# Patient Record
Sex: Male | Born: 1985 | Race: Black or African American | Hispanic: No | Marital: Single | Smoking: Former smoker
Health system: Southern US, Community
[De-identification: ages and names within clinical notes are randomized; demographics above are authoritative.]

## PROBLEM LIST (undated history)

## (undated) DIAGNOSIS — F419 Anxiety disorder, unspecified: Secondary | ICD-10-CM

## (undated) DIAGNOSIS — F17200 Nicotine dependence, unspecified, uncomplicated: Secondary | ICD-10-CM

## (undated) DIAGNOSIS — F329 Major depressive disorder, single episode, unspecified: Secondary | ICD-10-CM

## (undated) DIAGNOSIS — K029 Dental caries, unspecified: Secondary | ICD-10-CM

## (undated) DIAGNOSIS — F32A Depression, unspecified: Secondary | ICD-10-CM

## (undated) DIAGNOSIS — W3400XA Accidental discharge from unspecified firearms or gun, initial encounter: Secondary | ICD-10-CM

## (undated) DIAGNOSIS — R519 Headache, unspecified: Secondary | ICD-10-CM

## (undated) DIAGNOSIS — F319 Bipolar disorder, unspecified: Secondary | ICD-10-CM

## (undated) DIAGNOSIS — F209 Schizophrenia, unspecified: Secondary | ICD-10-CM

## (undated) DIAGNOSIS — R51 Headache: Secondary | ICD-10-CM

## (undated) HISTORY — PX: MULTIPLE TOOTH EXTRACTIONS: SHX2053

---

## 2016-08-23 ENCOUNTER — Inpatient Hospital Stay (HOSPITAL_COMMUNITY)
Admission: EM | Admit: 2016-08-23 | Discharge: 2016-08-29 | DRG: 981 | Disposition: A | Discharge status: Home Health | Payer: Medicaid Other | Attending: General Surgery | Admitting: General Surgery

## 2016-08-23 DIAGNOSIS — W3400XA Accidental discharge from unspecified firearms or gun, initial encounter: Secondary | ICD-10-CM

## 2016-08-23 DIAGNOSIS — R402253 Coma scale, best verbal response, oriented, at hospital admission: Secondary | ICD-10-CM | POA: Diagnosis present

## 2016-08-23 DIAGNOSIS — R402143 Coma scale, eyes open, spontaneous, at hospital admission: Secondary | ICD-10-CM | POA: Diagnosis present

## 2016-08-23 DIAGNOSIS — T148XXA Other injury of unspecified body region, initial encounter: Secondary | ICD-10-CM

## 2016-08-23 DIAGNOSIS — S3210XB Unspecified fracture of sacrum, initial encounter for open fracture: Secondary | ICD-10-CM | POA: Diagnosis present

## 2016-08-23 DIAGNOSIS — F1290 Cannabis use, unspecified, uncomplicated: Secondary | ICD-10-CM | POA: Diagnosis present

## 2016-08-23 DIAGNOSIS — R402363 Coma scale, best motor response, obeys commands, at hospital admission: Secondary | ICD-10-CM | POA: Diagnosis present

## 2016-08-23 DIAGNOSIS — F17210 Nicotine dependence, cigarettes, uncomplicated: Secondary | ICD-10-CM | POA: Diagnosis present

## 2016-08-23 DIAGNOSIS — Z87820 Personal history of traumatic brain injury: Secondary | ICD-10-CM

## 2016-08-23 DIAGNOSIS — S3662XA Contusion of rectum, initial encounter: Principal | ICD-10-CM | POA: Diagnosis present

## 2016-08-23 HISTORY — DX: Nicotine dependence, unspecified, uncomplicated: F17.200

## 2016-08-23 MED ORDER — FENTANYL CITRATE (PF) 100 MCG/2ML IJ SOLN
50.0000 ug | Freq: Once | INTRAMUSCULAR | Status: AC
  Administered 2016-08-24: 50 ug via INTRAVENOUS
  Filled 2016-08-23: qty 2

## 2016-08-23 MED ORDER — FENTANYL CITRATE (PF) 100 MCG/2ML IJ SOLN
INTRAMUSCULAR | Status: AC
  Administered 2016-08-24: 50 ug via INTRAVENOUS
  Filled 2016-08-23: qty 2

## 2016-08-23 MED ORDER — IOPAMIDOL (ISOVUE-300) INJECTION 61%
INTRAVENOUS | Status: AC
  Administered 2016-08-24: 100 mL
  Filled 2016-08-23: qty 100

## 2016-08-24 ENCOUNTER — Inpatient Hospital Stay (HOSPITAL_COMMUNITY): Payer: Medicaid Other

## 2016-08-24 ENCOUNTER — Emergency Department (HOSPITAL_COMMUNITY): Payer: Medicaid Other

## 2016-08-24 ENCOUNTER — Emergency Department (HOSPITAL_COMMUNITY): Payer: Medicaid Other | Admitting: Certified Registered"

## 2016-08-24 ENCOUNTER — Encounter (HOSPITAL_COMMUNITY): Admission: EM | Disposition: A | Discharge status: Home Health | Payer: Self-pay | Source: Home / Self Care

## 2016-08-24 ENCOUNTER — Encounter (HOSPITAL_COMMUNITY): Payer: Self-pay | Admitting: Emergency Medicine

## 2016-08-24 ENCOUNTER — Other Ambulatory Visit (HOSPITAL_COMMUNITY): Payer: Self-pay | Admitting: Radiology

## 2016-08-24 DIAGNOSIS — Z87820 Personal history of traumatic brain injury: Secondary | ICD-10-CM | POA: Diagnosis not present

## 2016-08-24 DIAGNOSIS — R402253 Coma scale, best verbal response, oriented, at hospital admission: Secondary | ICD-10-CM | POA: Diagnosis not present

## 2016-08-24 DIAGNOSIS — S31819A Unspecified open wound of right buttock, initial encounter: Secondary | ICD-10-CM | POA: Diagnosis present

## 2016-08-24 DIAGNOSIS — W3400XA Accidental discharge from unspecified firearms or gun, initial encounter: Secondary | ICD-10-CM | POA: Diagnosis not present

## 2016-08-24 DIAGNOSIS — F1290 Cannabis use, unspecified, uncomplicated: Secondary | ICD-10-CM | POA: Diagnosis not present

## 2016-08-24 DIAGNOSIS — S3210XB Unspecified fracture of sacrum, initial encounter for open fracture: Secondary | ICD-10-CM | POA: Diagnosis not present

## 2016-08-24 DIAGNOSIS — F17210 Nicotine dependence, cigarettes, uncomplicated: Secondary | ICD-10-CM | POA: Diagnosis not present

## 2016-08-24 DIAGNOSIS — S3662XA Contusion of rectum, initial encounter: Secondary | ICD-10-CM | POA: Diagnosis not present

## 2016-08-24 DIAGNOSIS — R402363 Coma scale, best motor response, obeys commands, at hospital admission: Secondary | ICD-10-CM | POA: Diagnosis not present

## 2016-08-24 DIAGNOSIS — R402143 Coma scale, eyes open, spontaneous, at hospital admission: Secondary | ICD-10-CM | POA: Diagnosis not present

## 2016-08-24 HISTORY — PX: COLOSTOMY: SHX63

## 2016-08-24 HISTORY — PX: LAPAROTOMY: SHX154

## 2016-08-24 LAB — PROTIME-INR
INR: 1.05
Prothrombin Time: 13.7 seconds (ref 11.4–15.2)

## 2016-08-24 LAB — URINALYSIS, ROUTINE W REFLEX MICROSCOPIC
BILIRUBIN URINE: NEGATIVE
Glucose, UA: NEGATIVE mg/dL
HGB URINE DIPSTICK: NEGATIVE
Ketones, ur: NEGATIVE mg/dL
Leukocytes, UA: NEGATIVE
NITRITE: NEGATIVE
PROTEIN: NEGATIVE mg/dL
pH: 5 (ref 5.0–8.0)

## 2016-08-24 LAB — PREPARE FRESH FROZEN PLASMA
UNIT DIVISION: 0
UNIT DIVISION: 0

## 2016-08-24 LAB — ETHANOL: Alcohol, Ethyl (B): <5 mg/dL (ref <5)

## 2016-08-24 LAB — COMPREHENSIVE METABOLIC PANEL
ALT: 22 U/L (ref 17–63)
ANION GAP: 10 (ref 5–15)
AST: 31 U/L (ref 15–41)
Albumin: 3.9 g/dL (ref 3.5–5.0)
Alkaline Phosphatase: 65 U/L (ref 38–126)
BUN: 9 mg/dL (ref 6–20)
CO2: 22 mmol/L (ref 22–32)
Calcium: 8.9 mg/dL (ref 8.9–10.3)
Chloride: 104 mmol/L (ref 101–111)
Creatinine, Ser: 1.24 mg/dL (ref 0.61–1.24)
Glucose, Bld: 133 mg/dL — ABNORMAL HIGH (ref 65–99)
POTASSIUM: 2.7 mmol/L — AB (ref 3.5–5.1)
Sodium: 136 mmol/L (ref 135–145)
TOTAL PROTEIN: 6.4 g/dL — AB (ref 6.5–8.1)
Total Bilirubin: 0.8 mg/dL (ref 0.3–1.2)

## 2016-08-24 LAB — CBC
HEMATOCRIT: 37.8 % — AB (ref 39.0–52.0)
HEMATOCRIT: 40.2 % (ref 39.0–52.0)
HEMOGLOBIN: 12.2 g/dL — AB (ref 13.0–17.0)
HEMOGLOBIN: 13.3 g/dL (ref 13.0–17.0)
MCH: 28.1 pg (ref 26.0–34.0)
MCH: 28.7 pg (ref 26.0–34.0)
MCHC: 32.3 g/dL (ref 30.0–36.0)
MCHC: 33.1 g/dL (ref 30.0–36.0)
MCV: 86.8 fL (ref 78.0–100.0)
MCV: 87.1 fL (ref 78.0–100.0)
Platelets: 266 10*3/uL (ref 150–400)
Platelets: 272 10*3/uL (ref 150–400)
RBC: 4.34 MIL/uL (ref 4.22–5.81)
RBC: 4.63 MIL/uL (ref 4.22–5.81)
RDW: 12.6 % (ref 11.5–15.5)
RDW: 12.6 % (ref 11.5–15.5)
WBC: 10.4 10*3/uL (ref 4.0–10.5)
WBC: 17.3 10*3/uL — AB (ref 4.0–10.5)

## 2016-08-24 LAB — CREATININE, SERUM: Creatinine, Ser: 1.2 mg/dL (ref 0.61–1.24)

## 2016-08-24 LAB — I-STAT CHEM 8, ED
BUN: 9 mg/dL (ref 6–20)
CALCIUM ION: 1.1 mmol/L — AB (ref 1.15–1.40)
CREATININE: 1.1 mg/dL (ref 0.61–1.24)
Chloride: 103 mmol/L (ref 101–111)
Glucose, Bld: 133 mg/dL — ABNORMAL HIGH (ref 65–99)
HCT: 41 % (ref 39.0–52.0)
Hemoglobin: 13.9 g/dL (ref 13.0–17.0)
Potassium: 3 mmol/L — ABNORMAL LOW (ref 3.5–5.1)
Sodium: 140 mmol/L (ref 135–145)
TCO2: 25 mmol/L (ref 0–100)

## 2016-08-24 LAB — ABO/RH: ABO/RH(D): B POS

## 2016-08-24 LAB — BPAM FFP
BLOOD PRODUCT EXPIRATION DATE: 201808092359
Blood Product Expiration Date: 201808092359
ISSUE DATE / TIME: 201808062338
ISSUE DATE / TIME: 201808062338
UNIT TYPE AND RH: 6200
UNIT TYPE AND RH: 6200

## 2016-08-24 LAB — BLOOD PRODUCT ORDER (VERBAL) VERIFICATION

## 2016-08-24 LAB — I-STAT CG4 LACTIC ACID, ED: LACTIC ACID, VENOUS: 4.29 mmol/L — AB (ref 0.5–1.9)

## 2016-08-24 LAB — CDS SEROLOGY

## 2016-08-24 LAB — MRSA PCR SCREENING: MRSA BY PCR: NEGATIVE

## 2016-08-24 SURGERY — LAPAROTOMY, EXPLORATORY
Anesthesia: General | Site: Abdomen

## 2016-08-24 MED ORDER — DIPHENHYDRAMINE HCL 50 MG/ML IJ SOLN: 25.0000 mg | Freq: Four times a day (QID) | INTRAMUSCULAR | Status: DC | PRN

## 2016-08-24 MED ORDER — DEXAMETHASONE SODIUM PHOSPHATE 10 MG/ML IJ SOLN
INTRAMUSCULAR | Status: DC | PRN
  Administered 2016-08-24: 10 mg via INTRAVENOUS

## 2016-08-24 MED ORDER — 0.9 % SODIUM CHLORIDE (POUR BTL) OPTIME
TOPICAL | Status: DC | PRN
  Administered 2016-08-24: 3000 mL

## 2016-08-24 MED ORDER — DIPHENHYDRAMINE HCL 25 MG PO CAPS: 25.0000 mg | ORAL_CAPSULE | Freq: Four times a day (QID) | ORAL | Status: DC | PRN

## 2016-08-24 MED ORDER — SUGAMMADEX SODIUM 200 MG/2ML IV SOLN
INTRAVENOUS | Status: DC | PRN
  Administered 2016-08-24: 200 mg via INTRAVENOUS

## 2016-08-24 MED ORDER — SUFENTANIL CITRATE 50 MCG/ML IV SOLN
INTRAVENOUS | Status: DC | PRN
  Administered 2016-08-24: 10 ug via INTRAVENOUS
  Administered 2016-08-24: 15 ug via INTRAVENOUS
  Administered 2016-08-24: 10 ug via INTRAVENOUS
  Administered 2016-08-24: 15 ug via INTRAVENOUS

## 2016-08-24 MED ORDER — SODIUM CHLORIDE 0.9% FLUSH: 9.0000 mL | INTRAVENOUS | Status: DC | PRN

## 2016-08-24 MED ORDER — FENTANYL CITRATE (PF) 100 MCG/2ML IJ SOLN
INTRAMUSCULAR | Status: AC
  Filled 2016-08-24: qty 2

## 2016-08-24 MED ORDER — ACETAMINOPHEN 500 MG PO TABS
1000.0000 mg | ORAL_TABLET | Freq: Three times a day (TID) | ORAL | Status: DC
  Administered 2016-08-24 – 2016-08-29 (×15): 1000 mg via ORAL
  Filled 2016-08-24 (×14): qty 2

## 2016-08-24 MED ORDER — GLYCOPYRROLATE 0.2 MG/ML IJ SOLN
INTRAMUSCULAR | Status: DC | PRN
  Administered 2016-08-24: 0.2 mg via INTRAVENOUS

## 2016-08-24 MED ORDER — GABAPENTIN 600 MG PO TABS
600.0000 mg | ORAL_TABLET | Freq: Three times a day (TID) | ORAL | Status: DC
  Administered 2016-08-24 – 2016-08-27 (×11): 600 mg via ORAL
  Filled 2016-08-24 (×13): qty 1

## 2016-08-24 MED ORDER — SODIUM CHLORIDE 0.9 % IV SOLN: INTRAVENOUS | Status: DC | PRN

## 2016-08-24 MED ORDER — HYDROMORPHONE HCL 1 MG/ML IJ SOLN
INTRAMUSCULAR | Status: AC
  Filled 2016-08-24: qty 1

## 2016-08-24 MED ORDER — ONDANSETRON HCL 4 MG/2ML IJ SOLN: 4.0000 mg | Freq: Four times a day (QID) | INTRAMUSCULAR | Status: DC | PRN

## 2016-08-24 MED ORDER — ROCURONIUM BROMIDE 100 MG/10ML IV SOLN
INTRAVENOUS | Status: DC | PRN
  Administered 2016-08-24: 50 mg via INTRAVENOUS

## 2016-08-24 MED ORDER — HYDROMORPHONE HCL 1 MG/ML IJ SOLN
0.2500 mg | INTRAMUSCULAR | Status: DC | PRN
  Administered 2016-08-24 (×2): 0.5 mg via INTRAVENOUS

## 2016-08-24 MED ORDER — SODIUM CHLORIDE 0.9 % IJ SOLN
INTRAMUSCULAR | Status: AC
  Filled 2016-08-24: qty 10

## 2016-08-24 MED ORDER — PROPOFOL 10 MG/ML IV BOLUS
INTRAVENOUS | Status: DC | PRN
  Administered 2016-08-24: 50 mg via INTRAVENOUS
  Administered 2016-08-24: 100 mg via INTRAVENOUS
  Administered 2016-08-24: 150 mg via INTRAVENOUS

## 2016-08-24 MED ORDER — LIDOCAINE 2% (20 MG/ML) 5 ML SYRINGE
INTRAMUSCULAR | Status: AC
  Filled 2016-08-24: qty 5

## 2016-08-24 MED ORDER — PROPOFOL 10 MG/ML IV BOLUS
INTRAVENOUS | Status: AC
  Filled 2016-08-24: qty 20

## 2016-08-24 MED ORDER — LACTATED RINGERS IV SOLN
INTRAVENOUS | Status: DC | PRN
  Administered 2016-08-24: 01:00:00 via INTRAVENOUS

## 2016-08-24 MED ORDER — SUCCINYLCHOLINE CHLORIDE 200 MG/10ML IV SOSY
PREFILLED_SYRINGE | INTRAVENOUS | Status: AC
  Filled 2016-08-24: qty 10

## 2016-08-24 MED ORDER — SUCCINYLCHOLINE CHLORIDE 20 MG/ML IJ SOLN
INTRAMUSCULAR | Status: DC | PRN
Start: 2016-08-24 — End: 2016-08-24
  Administered 2016-08-24: 140 mg via INTRAVENOUS

## 2016-08-24 MED ORDER — DIPHENHYDRAMINE HCL 12.5 MG/5ML PO ELIX: 12.5000 mg | ORAL_SOLUTION | Freq: Four times a day (QID) | ORAL | Status: DC | PRN

## 2016-08-24 MED ORDER — ONDANSETRON HCL 4 MG/2ML IJ SOLN: 4.0000 mg | Freq: Once | INTRAMUSCULAR | Status: DC | PRN

## 2016-08-24 MED ORDER — ENOXAPARIN SODIUM 40 MG/0.4ML ~~LOC~~ SOLN
40.0000 mg | SUBCUTANEOUS | Status: DC
  Administered 2016-08-26 – 2016-08-28 (×3): 40 mg via SUBCUTANEOUS
  Filled 2016-08-24 (×4): qty 0.4

## 2016-08-24 MED ORDER — MIDAZOLAM HCL 5 MG/5ML IJ SOLN
INTRAMUSCULAR | Status: DC | PRN
  Administered 2016-08-24: 2 mg via INTRAVENOUS

## 2016-08-24 MED ORDER — SUFENTANIL CITRATE 50 MCG/ML IV SOLN
INTRAVENOUS | Status: AC
  Filled 2016-08-24: qty 1

## 2016-08-24 MED ORDER — HYDRALAZINE HCL 20 MG/ML IJ SOLN: 10.0000 mg | INTRAMUSCULAR | Status: DC | PRN

## 2016-08-24 MED ORDER — HYDROMORPHONE 1 MG/ML IV SOLN
INTRAVENOUS | Status: DC
Start: 2016-08-24 — End: 2016-08-25
  Administered 2016-08-24: 0.6 mg via INTRAVENOUS
  Administered 2016-08-24: 10:00:00 via INTRAVENOUS
  Administered 2016-08-24 – 2016-08-25 (×2): 0.9 mg via INTRAVENOUS
  Filled 2016-08-24: qty 25

## 2016-08-24 MED ORDER — ONDANSETRON 4 MG PO TBDP
4.0000 mg | ORAL_TABLET | Freq: Four times a day (QID) | ORAL | Status: DC | PRN
  Administered 2016-08-25: 4 mg via ORAL
  Filled 2016-08-24: qty 1

## 2016-08-24 MED ORDER — PIPERACILLIN-TAZOBACTAM 3.375 G IVPB
3.3750 g | Freq: Three times a day (TID) | INTRAVENOUS | Status: DC
  Administered 2016-08-24 – 2016-08-29 (×15): 3.375 g via INTRAVENOUS
  Filled 2016-08-24 (×18): qty 50

## 2016-08-24 MED ORDER — KCL IN DEXTROSE-NACL 20-5-0.45 MEQ/L-%-% IV SOLN
INTRAVENOUS | Status: DC
  Administered 2016-08-24 – 2016-08-27 (×6): via INTRAVENOUS
  Filled 2016-08-24 (×8): qty 1000

## 2016-08-24 MED ORDER — TRAMADOL HCL 50 MG PO TABS: 50.0000 mg | ORAL_TABLET | Freq: Four times a day (QID) | ORAL | Status: DC | PRN

## 2016-08-24 MED ORDER — ONDANSETRON HCL 4 MG/2ML IJ SOLN
INTRAMUSCULAR | Status: DC | PRN
  Administered 2016-08-24: 4 mg via INTRAVENOUS

## 2016-08-24 MED ORDER — FENTANYL CITRATE (PF) 100 MCG/2ML IJ SOLN
INTRAMUSCULAR | Status: AC | PRN
  Administered 2016-08-23 – 2016-08-24 (×2): 50 ug via INTRAVENOUS

## 2016-08-24 MED ORDER — SODIUM CHLORIDE 0.9 % IV SOLN
INTRAVENOUS | Status: DC | PRN
  Administered 2016-08-23: 1000 mL via INTRAVENOUS

## 2016-08-24 MED ORDER — LIDOCAINE HCL (CARDIAC) 20 MG/ML IV SOLN
INTRAVENOUS | Status: DC | PRN
  Administered 2016-08-24: 100 mg via INTRATRACHEAL

## 2016-08-24 MED ORDER — DEXAMETHASONE SODIUM PHOSPHATE 10 MG/ML IJ SOLN
INTRAMUSCULAR | Status: AC
  Filled 2016-08-24: qty 1

## 2016-08-24 MED ORDER — DIPHENHYDRAMINE HCL 50 MG/ML IJ SOLN: 12.5000 mg | Freq: Four times a day (QID) | INTRAMUSCULAR | Status: DC | PRN

## 2016-08-24 MED ORDER — OXYCODONE HCL 5 MG PO TABS
5.0000 mg | ORAL_TABLET | ORAL | Status: DC | PRN
  Administered 2016-08-24 – 2016-08-26 (×5): 10 mg via ORAL
  Administered 2016-08-27: 5 mg via ORAL
  Administered 2016-08-27 – 2016-08-29 (×9): 10 mg via ORAL
  Filled 2016-08-24 (×9): qty 2
  Filled 2016-08-24: qty 1
  Filled 2016-08-24 (×5): qty 2

## 2016-08-24 MED ORDER — MEPERIDINE HCL 25 MG/ML IJ SOLN: 6.2500 mg | INTRAMUSCULAR | Status: DC | PRN

## 2016-08-24 MED ORDER — SUGAMMADEX SODIUM 200 MG/2ML IV SOLN
INTRAVENOUS | Status: AC
  Filled 2016-08-24: qty 2

## 2016-08-24 MED ORDER — ONDANSETRON HCL 4 MG/2ML IJ SOLN
INTRAMUSCULAR | Status: AC
  Filled 2016-08-24: qty 2

## 2016-08-24 MED ORDER — DEXTROSE 5 % IV SOLN
2.0000 g | Freq: Once | INTRAVENOUS | Status: AC
  Administered 2016-08-24: 2 g via INTRAVENOUS
  Filled 2016-08-24: qty 2

## 2016-08-24 MED ORDER — SODIUM CHLORIDE 0.9 % IV SOLN
INTRAVENOUS | Status: DC | PRN
  Administered 2016-08-24: 01:00:00 via INTRAVENOUS

## 2016-08-24 MED ORDER — METHOCARBAMOL 1000 MG/10ML IJ SOLN
1000.0000 mg | Freq: Three times a day (TID) | INTRAVENOUS | Status: DC | PRN
  Administered 2016-08-24: 1000 mg via INTRAVENOUS
  Filled 2016-08-24: qty 10

## 2016-08-24 MED ORDER — MIDAZOLAM HCL 2 MG/2ML IJ SOLN
INTRAMUSCULAR | Status: AC
  Filled 2016-08-24: qty 2

## 2016-08-24 MED ORDER — NALOXONE HCL 0.4 MG/ML IJ SOLN: 0.4000 mg | INTRAMUSCULAR | Status: DC | PRN

## 2016-08-24 MED ORDER — ROCURONIUM BROMIDE 10 MG/ML (PF) SYRINGE
PREFILLED_SYRINGE | INTRAVENOUS | Status: AC
  Filled 2016-08-24: qty 5

## 2016-08-24 MED ORDER — PANTOPRAZOLE SODIUM 40 MG IV SOLR
40.0000 mg | Freq: Every day | INTRAVENOUS | Status: DC
  Administered 2016-08-24 – 2016-08-27 (×4): 40 mg via INTRAVENOUS
  Filled 2016-08-24 (×4): qty 40

## 2016-08-24 MED ORDER — HYDROMORPHONE HCL 1 MG/ML IJ SOLN
1.0000 mg | INTRAMUSCULAR | Status: DC | PRN
  Administered 2016-08-24: 1 mg via INTRAVENOUS
  Filled 2016-08-24: qty 1

## 2016-08-24 SURGICAL SUPPLY — 36 items
BLADE CLIPPER SURG (BLADE) ×3 IMPLANT
CHLORAPREP W/TINT 26ML (MISCELLANEOUS) ×3 IMPLANT
COVER SURGICAL LIGHT HANDLE (MISCELLANEOUS) ×3 IMPLANT
DRAPE LAPAROSCOPIC ABDOMINAL (DRAPES) ×3 IMPLANT
DRAPE WARM FLUID 44X44 (DRAPE) ×3 IMPLANT
DRSG OPSITE POSTOP 4X10 (GAUZE/BANDAGES/DRESSINGS) ×3 IMPLANT
ELECT BLADE 6.5 EXT (BLADE) ×3 IMPLANT
ELECT CAUTERY BLADE 6.4 (BLADE) ×3 IMPLANT
ELECT REM PT RETURN 9FT ADLT (ELECTROSURGICAL) ×3
ELECTRODE REM PT RTRN 9FT ADLT (ELECTROSURGICAL) ×1 IMPLANT
GLOVE BIO SURGEON STRL SZ8 (GLOVE) ×3 IMPLANT
GLOVE BIOGEL PI IND STRL 8 (GLOVE) ×1 IMPLANT
GLOVE BIOGEL PI INDICATOR 8 (GLOVE) ×2
GOWN STRL REUS W/ TWL LRG LVL3 (GOWN DISPOSABLE) ×1 IMPLANT
GOWN STRL REUS W/ TWL XL LVL3 (GOWN DISPOSABLE) ×1 IMPLANT
GOWN STRL REUS W/TWL LRG LVL3 (GOWN DISPOSABLE) ×2
GOWN STRL REUS W/TWL XL LVL3 (GOWN DISPOSABLE) ×2
KIT BASIN OR (CUSTOM PROCEDURE TRAY) ×3 IMPLANT
KIT OSTOMY DRAINABLE 2.75 STR (WOUND CARE) ×3 IMPLANT
KIT ROOM TURNOVER OR (KITS) ×3 IMPLANT
NS IRRIG 1000ML POUR BTL (IV SOLUTION) ×12 IMPLANT
OSTOMY BRIDGE 2 1/2 (MISCELLANEOUS) ×3 IMPLANT
PACK GENERAL/GYN (CUSTOM PROCEDURE TRAY) ×3 IMPLANT
PAD ARMBOARD 7.5X6 YLW CONV (MISCELLANEOUS) ×6 IMPLANT
SPECIMEN JAR LARGE (MISCELLANEOUS) IMPLANT
SPONGE LAP 18X18 X RAY DECT (DISPOSABLE) ×6 IMPLANT
STAPLER VISISTAT 35W (STAPLE) ×3 IMPLANT
SUCTION POOLE TIP (SUCTIONS) ×3 IMPLANT
SUT PDS AB 1 TP1 96 (SUTURE) ×6 IMPLANT
SUT SILK 2 0 SH CR/8 (SUTURE) ×3 IMPLANT
SUT SILK 2 0 TIES 10X30 (SUTURE) ×3 IMPLANT
SUT SILK 3 0 SH CR/8 (SUTURE) ×3 IMPLANT
SUT SILK 3 0 TIES 10X30 (SUTURE) ×3 IMPLANT
TOWEL OR 17X26 10 PK STRL BLUE (TOWEL DISPOSABLE) ×3 IMPLANT
TRAY FOLEY W/METER SILVER 16FR (SET/KITS/TRAYS/PACK) ×3 IMPLANT
YANKAUER SUCT BULB TIP NO VENT (SUCTIONS) ×3 IMPLANT
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
## 2016-08-24 NOTE — Plan of Care (Signed)
Problem: Nutrition: Goal: Adequate nutrition will be maintained Outcome: Progressing Advanced to clears
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
## 2016-08-24 NOTE — Anesthesia Postprocedure Evaluation (Signed)
Anesthesia Post Note  Patient: Gasper SellsCorey XXXRay  Procedure(s) Performed: Procedure(s) (LRB): EXPLORATORY LAPAROTOMY (N/A) COLOSTOMY (N/A)     Patient location during evaluation: PACU Anesthesia Type: General Level of consciousness: awake and alert Pain management: pain level controlled Vital Signs Assessment: post-procedure vital signs reviewed and stable Respiratory status: spontaneous breathing, nonlabored ventilation, respiratory function stable and patient connected to nasal cannula oxygen Cardiovascular status: blood pressure returned to baseline and stable Postop Assessment: no signs of nausea or vomiting Anesthetic complications: no    Last Vitals:  Vitals:   08/24/16 0025 08/24/16 0226  BP: (!) 151/93 (!) (P) 152/86  Pulse: (!) 51 (P) 91  Resp: 19 (!) (P) 21  Temp:  (!) (P) 36.2 C    Last Pain:  Vitals:   08/23/16 2348  TempSrc:   PainSc: 10-Worst pain ever                 Vineta Carone DAVID

## 2016-08-24 NOTE — Anesthesia Procedure Notes (Signed)
Procedure Name: Intubation Date/Time: 08/24/2016 1:00 AM Performed by: Claris Che Pre-anesthesia Checklist: Patient identified, Emergency Drugs available, Suction available, Patient being monitored and Timeout performed Patient Re-evaluated:Patient Re-evaluated prior to induction Oxygen Delivery Method: Circle system utilized Preoxygenation: Pre-oxygenation with 100% oxygen Induction Type: IV induction, Rapid sequence and Cricoid Pressure applied Laryngoscope Size: Mac and 4 Grade View: Grade II Tube type: Oral Tube size: 8.0 mm Number of attempts: 1 Airway Equipment and Method: Stylet Placement Confirmation: ETT inserted through vocal cords under direct vision,  positive ETCO2 and breath sounds checked- equal and bilateral Secured at: 24 cm Tube secured with: Tape Dental Injury: Teeth and Oropharynx as per pre-operative assessment

## 2016-08-24 NOTE — ED Notes (Signed)
Patient taken to CT with RN, EMT and MD.

## 2016-08-24 NOTE — Anesthesia Preprocedure Evaluation (Signed)
Anesthesia Evaluation  Patient identified by MRN, date of birth, ID band Patient awake    Reviewed: Allergy & Precautions, NPO status , Patient's Chart, lab work & pertinent test results  Airway Mallampati: II  TM Distance: >3 FB Neck ROM: Full    Dental   Pulmonary    Pulmonary exam normal        Cardiovascular Normal cardiovascular exam     Neuro/Psych    GI/Hepatic   Endo/Other    Renal/GU      Musculoskeletal   Abdominal   Peds  Hematology   Anesthesia Other Findings   Reproductive/Obstetrics                             Anesthesia Physical Anesthesia Plan  ASA: II and emergent  Anesthesia Plan: General   Post-op Pain Management:    Induction: Intravenous, Rapid sequence and Cricoid pressure planned  PONV Risk Score and Plan: 2 and Ondansetron, Dexamethasone and Treatment may vary due to age or medical condition  Airway Management Planned: Oral ETT  Additional Equipment:   Intra-op Plan:   Post-operative Plan: Extubation in OR  Informed Consent: I have reviewed the patients History and Physical, chart, labs and discussed the procedure including the risks, benefits and alternatives for the proposed anesthesia with the patient or authorized representative who has indicated his/her understanding and acceptance.     Plan Discussed with: CRNA and Surgeon  Anesthesia Plan Comments:         Anesthesia Quick Evaluation
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
## 2016-08-24 NOTE — ED Notes (Signed)
Lactic acid results called to Dr.Delo
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
## 2016-08-24 NOTE — ED Notes (Signed)
Patient returns from CT and placed on monitor.

## 2016-08-24 NOTE — Transfer of Care (Signed)
Immediate Anesthesia Transfer of Care Note  Patient: Gasper SellsCorey XXXRay  Procedure(s) Performed: Procedure(s): EXPLORATORY LAPAROTOMY (N/A) COLOSTOMY (N/A)  Patient Location: PACU  Anesthesia Type:General  Level of Consciousness: sedated, drowsy, patient cooperative and responds to stimulation  Airway & Oxygen Therapy: Patient Spontanous Breathing and Patient connected to nasal cannula oxygen  Post-op Assessment: Report given to RN, Post -op Vital signs reviewed and stable and Patient moving all extremities X 4  Post vital signs: Reviewed and stable  Last Vitals:  Vitals:   08/24/16 0020 08/24/16 0025  BP: (!) 156/96 (!) 151/93  Pulse: (!) 51 (!) 51  Resp: (!) 21 19  Temp:      Last Pain:  Vitals:   08/23/16 2348  TempSrc:   PainSc: 10-Worst pain ever         Complications: No apparent anesthesia complications

## 2016-08-25 ENCOUNTER — Encounter (HOSPITAL_COMMUNITY): Payer: Self-pay

## 2016-08-25 LAB — TYPE AND SCREEN
ABO/RH(D): B POS
Antibody Screen: NEGATIVE
Unit division: 0
Unit division: 0

## 2016-08-25 LAB — BPAM RBC
Blood Product Expiration Date: 201808172359
Blood Product Expiration Date: 201808172359
ISSUE DATE / TIME: 201808062337
ISSUE DATE / TIME: 201808062337
Unit Type and Rh: 9500
Unit Type and Rh: 9500

## 2016-08-25 LAB — CBC
HEMATOCRIT: 33.5 % — AB (ref 39.0–52.0)
Hemoglobin: 10.8 g/dL — ABNORMAL LOW (ref 13.0–17.0)
MCH: 28.1 pg (ref 26.0–34.0)
MCHC: 32.2 g/dL (ref 30.0–36.0)
MCV: 87 fL (ref 78.0–100.0)
PLATELETS: 206 10*3/uL (ref 150–400)
RBC: 3.85 MIL/uL — ABNORMAL LOW (ref 4.22–5.81)
RDW: 12.9 % (ref 11.5–15.5)
WBC: 11.1 10*3/uL — AB (ref 4.0–10.5)

## 2016-08-25 LAB — BASIC METABOLIC PANEL
ANION GAP: 5 (ref 5–15)
BUN: 6 mg/dL (ref 6–20)
CO2: 29 mmol/L (ref 22–32)
CREATININE: 1.2 mg/dL (ref 0.61–1.24)
Calcium: 8.4 mg/dL — ABNORMAL LOW (ref 8.9–10.3)
Chloride: 102 mmol/L (ref 101–111)
Glucose, Bld: 131 mg/dL — ABNORMAL HIGH (ref 65–99)
Potassium: 4 mmol/L (ref 3.5–5.1)
SODIUM: 136 mmol/L (ref 135–145)

## 2016-08-25 MED ORDER — HYDROMORPHONE HCL 1 MG/ML IJ SOLN
1.0000 mg | INTRAMUSCULAR | Status: DC | PRN
  Administered 2016-08-25 (×3): 1 mg via INTRAVENOUS
  Filled 2016-08-25 (×3): qty 1
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
## 2016-08-26 LAB — CBC
HEMATOCRIT: 29.7 % — AB (ref 39.0–52.0)
Hemoglobin: 9.4 g/dL — ABNORMAL LOW (ref 13.0–17.0)
MCH: 27.8 pg (ref 26.0–34.0)
MCHC: 31.6 g/dL (ref 30.0–36.0)
MCV: 87.9 fL (ref 78.0–100.0)
Platelets: 194 10*3/uL (ref 150–400)
RBC: 3.38 MIL/uL — AB (ref 4.22–5.81)
RDW: 12.9 % (ref 11.5–15.5)
WBC: 8.9 10*3/uL (ref 4.0–10.5)

## 2016-08-26 LAB — BASIC METABOLIC PANEL
ANION GAP: 7 (ref 5–15)
BUN: <5 mg/dL — ABNORMAL LOW (ref 6–20)
CHLORIDE: 102 mmol/L (ref 101–111)
CO2: 30 mmol/L (ref 22–32)
CREATININE: 1.19 mg/dL (ref 0.61–1.24)
Calcium: 8.5 mg/dL — ABNORMAL LOW (ref 8.9–10.3)
GFR calc non Af Amer: >60 mL/min (ref >60)
Glucose, Bld: 123 mg/dL — ABNORMAL HIGH (ref 65–99)
POTASSIUM: 4 mmol/L (ref 3.5–5.1)
SODIUM: 139 mmol/L (ref 135–145)

## 2016-08-26 MED ORDER — WHITE PETROLATUM GEL
Status: AC
  Administered 2016-08-26: 17:00:00
  Filled 2016-08-26: qty 1
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
## 2016-08-26 NOTE — Consult Note (Signed)
WOC Nurse ostomy follow up Stoma type/location: LLQ, loop colostomy Stomal assessment/size: 2 1/4 wafer cut to try to accommodate support rod Peristomal assessment: intact  Treatment options for stomal/peristomal skin: NA Output bloody no stool at this time Ostomy pouching: 2pc. 2 3/4" in place  Education provided:  Reviewed pouch change schedule and we will plan to change pouch again tomorrow with patient assisting WOC nurse.  He was able to get up to the chair today with me and I answered some of his questions about ostomy care.  [place] in [place] [facility] [place] Dis[place]: Yes  WOC Nurse will follow along with you for continued support with ostomy teaching and care Meranda Dechaine [facility] MSN, RN, Clifford[place]OCN, CNS, [place] [phone]

## 2016-08-27 LAB — BASIC METABOLIC PANEL
Anion gap: 6 (ref 5–15)
CALCIUM: 8.5 mg/dL — AB (ref 8.9–10.3)
CHLORIDE: 101 mmol/L (ref 101–111)
CO2: 29 mmol/L (ref 22–32)
CREATININE: 1.1 mg/dL (ref 0.61–1.24)
GFR calc Af Amer: >60 mL/min (ref >60)
GFR calc non Af Amer: >60 mL/min (ref >60)
GLUCOSE: 110 mg/dL — AB (ref 65–99)
Potassium: 3.6 mmol/L (ref 3.5–5.1)
Sodium: 136 mmol/L (ref 135–145)

## 2016-08-27 LAB — CBC
HEMATOCRIT: 28 % — AB (ref 39.0–52.0)
Hemoglobin: 9 g/dL — ABNORMAL LOW (ref 13.0–17.0)
MCH: 28.3 pg (ref 26.0–34.0)
MCHC: 32.1 g/dL (ref 30.0–36.0)
MCV: 88.1 fL (ref 78.0–100.0)
Platelets: 194 10*3/uL (ref 150–400)
RBC: 3.18 MIL/uL — ABNORMAL LOW (ref 4.22–5.81)
RDW: 12.8 % (ref 11.5–15.5)
WBC: 7.2 10*3/uL (ref 4.0–10.5)

## 2016-08-27 MED ORDER — KCL IN DEXTROSE-NACL 20-5-0.45 MEQ/L-%-% IV SOLN
INTRAVENOUS | Status: DC
  Administered 2016-08-27: 21:00:00 via INTRAVENOUS
  Filled 2016-08-27: qty 1000

## 2016-08-27 MED ORDER — METHOCARBAMOL 500 MG PO TABS
1000.0000 mg | ORAL_TABLET | Freq: Three times a day (TID) | ORAL | Status: DC | PRN
  Administered 2016-08-27 (×2): 1000 mg via ORAL
  Filled 2016-08-27 (×2): qty 2

## 2016-08-27 MED ORDER — TRAMADOL HCL 50 MG PO TABS: 50.0000 mg | ORAL_TABLET | Freq: Four times a day (QID) | ORAL | Status: DC | PRN
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
## 2016-08-27 NOTE — Consult Note (Signed)
WOC Nurse ostomy follow up Stoma type/location: LLQ, loop colostomy Stomal assessment/size: 2 1/4" wafer cut  to accommodate support rod Peristomal assessment: intact  Treatment options for stomal/peristomal skin: NA Output is bloody, less than 20cc in old pouch no, stool at this time Ostomy pouching: 2pc. 2 3/4" placed Education provided: Pt able to demonstrate opening, and closing of pouch. Patient able to verbalize cleaning pouch after emptying.  Patient sitting in recliner for pouch change, he showed a lot of interest and was not uncomfortable looking at stoma.  I answered  his questions about ostomy care. I gave him a print out of procedure to obtain free supplies after discharge since he has no insurance. Patient states he will give it to his mother and let her handle it. Enrolled patient in [facility] Secure Start Discharge program: Yes, previously. We will continue to follow this patient and remain available to this patient, nursing, and the medical and surgical teams. Rod to be removed prior to discharge. PA stated it was not to be removed today. Questions answered and supplies ordered, 4 rings, 4 barriers and 4 pouches to be delivered to bedside.   Barnett HatterMelinda Luticia Tadros, RN-C, WTA-C Wound Treatment Associate
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
## 2016-08-28 MED ORDER — PANTOPRAZOLE SODIUM 40 MG PO TBEC
40.0000 mg | DELAYED_RELEASE_TABLET | Freq: Every day | ORAL | Status: DC
  Administered 2016-08-28: 40 mg via ORAL
  Filled 2016-08-28: qty 1

## 2016-08-28 MED ORDER — GABAPENTIN 400 MG PO CAPS
400.0000 mg | ORAL_CAPSULE | Freq: Three times a day (TID) | ORAL | Status: DC
  Administered 2016-08-28 – 2016-08-29 (×4): 400 mg via ORAL
  Filled 2016-08-28 (×4): qty 1

## 2016-08-28 MED ORDER — METHOCARBAMOL 750 MG PO TABS: 750.0000 mg | ORAL_TABLET | Freq: Three times a day (TID) | ORAL | Status: DC | PRN

## 2016-08-28 MED ORDER — TRAMADOL HCL 50 MG PO TABS: 50.0000 mg | ORAL_TABLET | Freq: Four times a day (QID) | ORAL | Status: DC | PRN
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
## 2016-08-29 MED ORDER — AMOXICILLIN-POT CLAVULANATE 875-125 MG PO TABS: 1.0000 | ORAL_TABLET | Freq: Two times a day (BID) | ORAL | 0 refills | Status: AC

## 2016-08-29 MED ORDER — GABAPENTIN 400 MG PO CAPS: 400.0000 mg | ORAL_CAPSULE | Freq: Three times a day (TID) | ORAL | 0 refills | Status: DC

## 2016-08-29 MED ORDER — TRAMADOL HCL 50 MG PO TABS: 50.0000 mg | ORAL_TABLET | Freq: Four times a day (QID) | ORAL | 0 refills | Status: DC | PRN

## 2016-08-29 MED ORDER — OXYCODONE HCL 10 MG PO TABS: 10.0000 mg | ORAL_TABLET | ORAL | 0 refills | Status: DC | PRN

## 2016-08-29 NOTE — Discharge Instructions (Signed)
Stitches, Staples, or Adhesive Wound Closure  Doctors use stitches (sutures), staples, and certain glue (skin adhesives) to hold your skin together while it heals (wound closure). You may need this treatment after you have surgery or if you cut your skin accidentally. These methods help your skin heal more quickly. They also make it less likely that you will have a scar.  What are the different kinds of wound closures?  There are many options for wound closure. The one that your doctor uses depends on how deep and large your wound is.  Adhesive Glue  To use this glue to close a wound, your doctor holds the edges of the wound together and paints the glue on the surface of your skin. You may need more than one layer of glue. Then the wound may be covered with a light bandage (dressing).  This type of skin closure may be used for small wounds that are not deep (superficial). Using glue for wound closure is less painful than other methods. It does not require a medicine that numbs the area. This method also leaves nothing to be removed. Adhesive glue is often used for children and on facial wounds.  Adhesive glue cannot be used for wounds that are deep, uneven, or bleeding. It is not used inside of a wound.  Adhesive Strips  These strips are made of sticky (adhesive), porous paper. They are placed across your skin edges like a regular adhesive bandage. You leave them on until they fall off.  Adhesive strips may be used to close very superficial wounds. They may also be used along with sutures to improve closure of your skin edges.  Sutures  Sutures are the oldest method of wound closure. Sutures can be made from natural or synthetic materials. They can be made from a material that your body can break down as your wound heals (absorbable), or they can be made from a material that needs to be removed from your skin (nonabsorbable). They come in many different strengths and sizes.  Your doctor attaches the sutures to a  steel needle on one end. Sutures can be passed through your skin, or through the tissues beneath your skin. Then they are tied and cut. Your skin edges may be closed in one continuous stitch or in separate stitches.  Sutures are strong and can be used for all kinds of wounds. Absorbable sutures may be used to close tissues under the skin. The disadvantage of sutures is that they may cause skin reactions that lead to infection. Nonabsorbable sutures need to be removed.  Staples  When surgical staples are used to close a wound, the edges of your skin on both sides of the wound are brought close together. A staple is placed across the wound, and an instrument secures the edges together. Staples are often used to close surgical cuts (incisions).  Staples are faster to use than sutures, and they cause less reaction from your skin. Staples need to be removed using a tool that bends the staples away from your skin.  How do I care for my wound closure?   Take medicines only as told by your doctor.   If you were prescribed an antibiotic medicine for your wound, finish it all even if you start to feel better.   Use ointments or creams only as told by your doctor.   Wash your hands with soap and water before and after touching your wound.   Do not soak your wound in water. Do   not take baths, swim, or use a hot tub until your doctor says it is okay.   Ask your doctor when you can start showering. Cover your wound if told by your doctor.   Do not take out your own sutures or staples.   Do not pick at your wound. Picking can cause an infection.   Keep all follow-up visits as told by your doctor. This is important.  How long will I have my wound closure?   Leave adhesive glue on your skin until the glue peels away.   Leave adhesive strips on your skin until they fall off.   Absorbable sutures will dissolve within several days.   Nonabsorbable sutures and staples must be removed. The location of the wound will  determine how long they stay in. This can range from several days to a couple of weeks.  When should I seek help for my wound closure?  Contact your doctor if:   You have a fever.   You have chills.   You have redness, puffiness (swelling), or pain at the site of your wound.   You have fluid, blood, or pus coming from your wound.   There is a bad smell coming from your wound.   The skin edges of your wound start to separate after your sutures have been removed.   Your wound becomes thick, raised, and darker in color after your sutures come out (scarring).    This information is not intended to replace advice given to you by your health care provider. Make sure you discuss any questions you have with your health care provider.  Document Released: 11/01/2008 Document Revised: 09/03/2015 Document Reviewed: 06/13/2013  [employer]vier Interactive Patient Education  2018 [employer]
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
## 2016-08-29 NOTE — Care Management Note (Addendum)
Case Management Note  Patient Details  Name: Gasper SellsCorey XXXRay MRN: [id number] Date of Birth: [birth year]  Subjective/Objective: pt to be discharged today. [facility] provide services [place]. RW will be delivered prior to discharge. Pt also will need MATCH letter as he has no Insurance coverage and will need analgesics for pain controal                   Action/Plan: CM will sign off for now but will be available should additional discharge needs arise or disposition change.    Expected Discharge Date:  08/29/16               Expected Discharge Plan:  Home w Home Health Services  In-House Referral:  Clinical Social Work  Discharge planning Services  CM Consult  Post Acute Care Choice:  Home Health, Durable Medical Equipment Choice offered to:  Patient  DME Arranged:  Walker rolling DME Agency:  [facility]  [facility]  Status of Service:  Completed, signed off  If discussed at Long Length of Stay Meetings, dates discussed:    Additional Comments:  Yvone NeuCrutchfield, Rico Massar M, RN 08/29/2016, 10:58 AM

## 2016-08-29 NOTE — Progress Notes (Signed)
Discharge paperwork given to patient. Prescription given. No questions verbalized. Patient is ready for discharge.  

## 2016-08-31 ENCOUNTER — Telehealth (HOSPITAL_COMMUNITY): Payer: Self-pay

## 2016-08-31 NOTE — Telephone Encounter (Signed)
[phone] Jose MadridSara Obrien calling for Jose Obrien, his Augmentin is too big for him to swallow. Needs a different antibiotic. [facility] [address] [place].

## 2016-08-31 NOTE — Telephone Encounter (Signed)
Returned phone call to VF CorporationSara Obrien. Advised that he can crush the antibiotic up and place it in applesauce if that makes it easier for him to take. States that she tried that last night and it worked well. No further needs.

## 2016-09-07 ENCOUNTER — Telehealth (HOSPITAL_COMMUNITY): Payer: Self-pay

## 2016-09-07 NOTE — Telephone Encounter (Signed)
[phone] mom called said he has appt. Tomorrow Wed. But he is out of medication and wanted to know if could get a refill .  He is in a lot of pain. She said they [address] [facility] [address]

## 2016-09-07 NOTE — Telephone Encounter (Signed)
Spoke with patient's mother. Advised that we cannot prescribe narcotic pain medication without seeing patient. He does have an appointment in the morning with Dr. Janee Morn and can discuss refills at that time. Until then ok to take naproxen 500mg  BID and tylenol 1000mg  TID. No further needs at this time.

## 2016-09-07 NOTE — Telephone Encounter (Signed)
Attempted to return phone call to patient's mother, no answer. Unable to leave voicemail because box was full. Will try again later.

## 2017-01-26 ENCOUNTER — Other Ambulatory Visit: Payer: Self-pay | Admitting: General Surgery

## 2017-01-26 ENCOUNTER — Ambulatory Visit: Payer: Self-pay | Admitting: General Surgery

## 2017-01-31 ENCOUNTER — Other Ambulatory Visit: Payer: Self-pay | Admitting: General Surgery

## 2017-01-31 DIAGNOSIS — Z09 Encounter for follow-up examination after completed treatment for conditions other than malignant neoplasm: Secondary | ICD-10-CM

## 2017-02-07 ENCOUNTER — Other Ambulatory Visit: Payer: Self-pay

## 2017-02-11 ENCOUNTER — Ambulatory Visit
Admission: RE | Admit: 2017-02-11 | Discharge: 2017-02-11 | Disposition: A | Discharge status: Home / Self Care | Payer: Medicaid Other | Source: Ambulatory Visit | Attending: General Surgery | Admitting: General Surgery

## 2017-02-11 DIAGNOSIS — Z09 Encounter for follow-up examination after completed treatment for conditions other than malignant neoplasm: Secondary | ICD-10-CM

## 2017-02-28 ENCOUNTER — Inpatient Hospital Stay (HOSPITAL_COMMUNITY)
Admission: RE | Admit: 2017-02-28 | Discharge: 2017-02-28 | Disposition: A | Discharge status: Home / Self Care | Payer: Medicaid Other | Source: Ambulatory Visit

## 2017-02-28 NOTE — Pre-Procedure Instructions (Signed)
Jose Obrien  02/28/2017      [id number] - [place] - [address] [place] [address] Phone: [phone] Fax: [phone]    Your procedure is scheduled on Thursday, March 03, 2017   Report to [facility] Admitting at 5:30 A.M.             (posted surgery time 7:30a - 10:a)   Call this number if you have problems the morning of surgery:  [phone]   Remember: Drink the Ensure Pre-Surgery before leaving home on the morning of surgery.   Do not eat food or drink liquids after midnight Wednesday.   Take these medicines the morning of surgery with A SIP OF WATER : None  Stop taking Aspirin, vitamins, fish oil and herbal medications. Do not take any NSAIDs ie: Ibuprofen, Advil, Naproxen (Aleve), Motrin BC and Goody Powder; stop now.  Do not wear jewelry - NO rings or watches.  Do not wear lotions, colognes or deodorant.             Men may shave face and neck.   Do not bring valuables to the hospital.  [facility] is not responsible for any belongings or valuables.  Contacts, dentures or bridgework may not be worn into surgery.  Leave your suitcase in the car.  After surgery it may be brought to your room.  For patients admitted to the hospital, discharge time will be determined by your treatment team.  Please read over the following fact sheets that you were given. Pain Booklet, Coughing and Deep Breathing and Surgical Site Infection Prevention     - Preparing For Surgery  Before surgery, you can play an important role. Because skin is not sterile, your skin needs to be as free of germs as possible. You can reduce the number of germs on your skin by washing with CHG (chlorahexidine gluconate) Soap before surgery.  CHG is an antiseptic cleaner which kills germs and bonds with the skin to continue killing germs even after washing.  Please do not use if you have an allergy to CHG  or antibacterial soaps. If your skin becomes reddened/irritated stop using the CHG.  Do not shave (including legs and underarms) for at least 48 hours prior to first CHG shower. It is OK to shave your face.  Please follow these instructions carefully.   1. Shower the NIGHT BEFORE SURGERY and the MORNING OF SURGERY with CHG.   2. If you chose to wash your hair, wash your hair first as usual with your normal shampoo.  3. After you shampoo, rinse your hair and body thoroughly to remove the shampoo.  4. Use CHG as you would any other liquid soap. You can apply CHG directly to the skin and wash gently with a scrungie or a clean washcloth.   5. Apply the CHG Soap to your body ONLY FROM THE NECK DOWN.  Do not use on open wounds or open sores. Avoid contact with your eyes, ears, mouth and genitals (private parts). Wash Face and genitals (private parts)  with your normal soap.  6. Wash thoroughly, paying special attention to the area where your surgery will be performed.  7. Thoroughly rinse your body with warm water from the neck down.  8. DO NOT shower/wash with your normal soap after using and rinsing off the CHG Soap.  9. Pat yourself dry with  a CLEAN TOWEL.  10. Wear CLEAN PAJAMAS to bed the night before surgery, wear comfortable clothes the morning of surgery  11. Place CLEAN SHEETS on your bed the night of your first shower and DO NOT SLEEP WITH PETS.    Day of Surgery: Do not apply any deodorants/lotions. Please wear clean clothes to the hospital/surgery center.

## 2017-02-28 NOTE — Pre-Procedure Instructions (Signed)
    Jose Obrien  02/28/2017      [id number] - [place] - [address] [place] [address] Phone: [phone] Fax: [phone]    Your procedure is scheduled on Thursday, March 03, 2017  [facility] at 5:30 A.M.  Call this number if you have problems the morning of surgery:  [phone]   Remember: Drink the Ensure Pre-Surgery before leaving home on the morning of surgery.  Do not eat food or drink liquids after midnight Wednesday, March 02, 2017  Take these medicines the morning of surgery with A SIP OF WATER : None Stop taking Aspirin, vitamins, fish oil and herbal medications. Do not take any NSAIDs ie: Ibuprofen, Advil, Naproxen (Aleve), Motrin BC and Goody Powder; stop now.  Do not wear jewelry, make-up or nail polish.  Do not wear lotions, powders, or perfumes, or deodorant.  Do not shave 48 hours prior to surgery.  Men may shave face and neck.  Do not bring valuables to the hospital.  [facility] is not responsible for any belongings or valuables.  Contacts, dentures or bridgework may not be worn into surgery.  Leave your suitcase in the car.  After surgery it may be brought to your room. For patients admitted to the hospital, discharge time will be determined by your treatment team. Patients discharged the day of surgery will not be allowed to drive home.  Special instructions:Shower the night before surgery and the morning of surgery with CHG. Please read over the following fact sheets that you were given. Pain Booklet, Coughing and Deep Breathing and Surgical Site Infection Prevention

## 2017-03-02 ENCOUNTER — Encounter (HOSPITAL_COMMUNITY): Payer: Self-pay | Admitting: *Deleted

## 2017-03-02 ENCOUNTER — Other Ambulatory Visit: Payer: Self-pay

## 2017-03-02 MED ORDER — CEFOTETAN DISODIUM-DEXTROSE 2-2.08 GM-%(50ML) IV SOLR
2.0000 g | INTRAVENOUS | Status: AC
  Administered 2017-03-03: 2 g via INTRAVENOUS
  Filled 2017-03-02: qty 50

## 2017-03-02 NOTE — Progress Notes (Signed)
Pt denies SOB, chest pain, and being under the care of a cardiologist. Pt denies having a stress test and cardiac cath. Pt denies having an EKG within the last year. Pt denies recent labs. Pt requested that mother, Maralyn SagoSarah complete the rest of the assessment and be provided pre-op instructions. Mother made aware to have pt stop taking  Aspirin, vitamins, fish oil and herbal medications. Do not take any NSAIDs ie: Ibuprofen, Advil, Naproxen (Aleve), Motrin, BC and Goody Powder or any medication containing Aspirin. Mother verbalized understanding of all pre-op instructions.

## 2017-03-02 NOTE — Progress Notes (Signed)
Nettie ElmSylvia, Triage Coordinator at [facility] to clarify and follow up with pt if a clear diet is needed today.

## 2017-03-03 ENCOUNTER — Encounter (HOSPITAL_COMMUNITY): Payer: Self-pay | Admitting: Certified Registered Nurse Anesthetist

## 2017-03-03 ENCOUNTER — Inpatient Hospital Stay (HOSPITAL_COMMUNITY): Payer: Medicaid Other | Admitting: Certified Registered Nurse Anesthetist

## 2017-03-03 ENCOUNTER — Inpatient Hospital Stay (HOSPITAL_COMMUNITY)
Admission: RE | Admit: 2017-03-03 | Discharge: 2017-03-08 | DRG: 331 | Disposition: A | Discharge status: Home / Self Care | Payer: Medicaid Other | Attending: General Surgery | Admitting: General Surgery

## 2017-03-03 ENCOUNTER — Encounter (HOSPITAL_COMMUNITY): Admission: RE | Disposition: A | Discharge status: Home / Self Care | Payer: Self-pay | Source: Home / Self Care

## 2017-03-03 DIAGNOSIS — Z433 Encounter for attention to colostomy: Principal | ICD-10-CM

## 2017-03-03 DIAGNOSIS — F17210 Nicotine dependence, cigarettes, uncomplicated: Secondary | ICD-10-CM | POA: Diagnosis present

## 2017-03-03 DIAGNOSIS — F209 Schizophrenia, unspecified: Secondary | ICD-10-CM | POA: Diagnosis present

## 2017-03-03 DIAGNOSIS — Z9889 Other specified postprocedural states: Secondary | ICD-10-CM

## 2017-03-03 DIAGNOSIS — Z23 Encounter for immunization: Secondary | ICD-10-CM

## 2017-03-03 DIAGNOSIS — F319 Bipolar disorder, unspecified: Secondary | ICD-10-CM | POA: Diagnosis present

## 2017-03-03 HISTORY — DX: Headache: R51

## 2017-03-03 HISTORY — DX: Accidental discharge from unspecified firearms or gun, initial encounter: W34.00XA

## 2017-03-03 HISTORY — DX: Major depressive disorder, single episode, unspecified: F32.9

## 2017-03-03 HISTORY — DX: Dental caries, unspecified: K02.9

## 2017-03-03 HISTORY — DX: Schizophrenia, unspecified: F20.9

## 2017-03-03 HISTORY — PX: COLOSTOMY TAKEDOWN: SHX5783

## 2017-03-03 HISTORY — DX: Depression, unspecified: F32.A

## 2017-03-03 HISTORY — DX: Headache, unspecified: R51.9

## 2017-03-03 HISTORY — DX: Anxiety disorder, unspecified: F41.9

## 2017-03-03 HISTORY — DX: Bipolar disorder, unspecified: F31.9

## 2017-03-03 LAB — CREATININE, SERUM
Creatinine, Ser: 1.01 mg/dL (ref 0.61–1.24)
GFR calc non Af Amer: >60 mL/min (ref >60)

## 2017-03-03 LAB — CBC
HCT: 42 % (ref 39.0–52.0)
Hemoglobin: 14 g/dL (ref 13.0–17.0)
MCH: 29.3 pg (ref 26.0–34.0)
MCHC: 33.3 g/dL (ref 30.0–36.0)
MCV: 87.9 fL (ref 78.0–100.0)
PLATELETS: 272 10*3/uL (ref 150–400)
RBC: 4.78 MIL/uL (ref 4.22–5.81)
RDW: 13.5 % (ref 11.5–15.5)
WBC: 14.4 10*3/uL — AB (ref 4.0–10.5)

## 2017-03-03 SURGERY — CLOSURE, COLOSTOMY
Anesthesia: General | Site: Abdomen

## 2017-03-03 MED ORDER — SODIUM CHLORIDE 0.9% FLUSH: 9.0000 mL | INTRAVENOUS | Status: DC | PRN

## 2017-03-03 MED ORDER — PROMETHAZINE HCL 25 MG/ML IJ SOLN
INTRAMUSCULAR | Status: AC
  Administered 2017-03-03: 6.25 mg via INTRAVENOUS
  Filled 2017-03-03: qty 1

## 2017-03-03 MED ORDER — MIDAZOLAM HCL 2 MG/2ML IJ SOLN
INTRAMUSCULAR | Status: AC
  Filled 2017-03-03: qty 2

## 2017-03-03 MED ORDER — KCL IN DEXTROSE-NACL 20-5-0.45 MEQ/L-%-% IV SOLN
INTRAVENOUS | Status: DC
  Administered 2017-03-03 – 2017-03-05 (×6): via INTRAVENOUS
  Filled 2017-03-03 (×6): qty 1000

## 2017-03-03 MED ORDER — ONDANSETRON HCL 4 MG/2ML IJ SOLN
INTRAMUSCULAR | Status: AC
  Filled 2017-03-03: qty 2

## 2017-03-03 MED ORDER — CHLORHEXIDINE GLUCONATE CLOTH 2 % EX PADS: 6.0000 | MEDICATED_PAD | Freq: Once | CUTANEOUS | Status: DC

## 2017-03-03 MED ORDER — 0.9 % SODIUM CHLORIDE (POUR BTL) OPTIME
TOPICAL | Status: DC | PRN
  Administered 2017-03-03 (×3): 1000 mL

## 2017-03-03 MED ORDER — PROPOFOL 10 MG/ML IV BOLUS
INTRAVENOUS | Status: AC
  Filled 2017-03-03: qty 20

## 2017-03-03 MED ORDER — KCL IN DEXTROSE-NACL 20-5-0.45 MEQ/L-%-% IV SOLN
INTRAVENOUS | Status: AC
  Filled 2017-03-03: qty 1000

## 2017-03-03 MED ORDER — CELECOXIB 200 MG PO CAPS
200.0000 mg | ORAL_CAPSULE | Freq: Two times a day (BID) | ORAL | Status: DC
  Administered 2017-03-03 – 2017-03-08 (×11): 200 mg via ORAL
  Filled 2017-03-03 (×11): qty 1

## 2017-03-03 MED ORDER — FENTANYL CITRATE (PF) 250 MCG/5ML IJ SOLN
INTRAMUSCULAR | Status: AC
  Filled 2017-03-03: qty 5

## 2017-03-03 MED ORDER — PANTOPRAZOLE SODIUM 40 MG IV SOLR
40.0000 mg | Freq: Every day | INTRAVENOUS | Status: DC
  Administered 2017-03-03 – 2017-03-05 (×3): 40 mg via INTRAVENOUS
  Filled 2017-03-03 (×3): qty 40

## 2017-03-03 MED ORDER — METHOCARBAMOL 1000 MG/10ML IJ SOLN
1000.0000 mg | Freq: Three times a day (TID) | INTRAVENOUS | Status: DC | PRN
  Administered 2017-03-05: 1000 mg via INTRAVENOUS
  Filled 2017-03-03: qty 10

## 2017-03-03 MED ORDER — HYDROMORPHONE 1 MG/ML IV SOLN
INTRAVENOUS | Status: AC
  Filled 2017-03-03: qty 25

## 2017-03-03 MED ORDER — ALVIMOPAN 12 MG PO CAPS
12.0000 mg | ORAL_CAPSULE | ORAL | Status: AC
  Administered 2017-03-03: 12 mg via ORAL
  Filled 2017-03-03: qty 1

## 2017-03-03 MED ORDER — GABAPENTIN 300 MG PO CAPS
300.0000 mg | ORAL_CAPSULE | Freq: Two times a day (BID) | ORAL | Status: DC
  Administered 2017-03-03 – 2017-03-08 (×11): 300 mg via ORAL
  Filled 2017-03-03 (×11): qty 1

## 2017-03-03 MED ORDER — ONDANSETRON HCL 4 MG/2ML IJ SOLN: 4.0000 mg | Freq: Four times a day (QID) | INTRAMUSCULAR | Status: DC | PRN

## 2017-03-03 MED ORDER — DIPHENHYDRAMINE HCL 50 MG/ML IJ SOLN
12.5000 mg | Freq: Four times a day (QID) | INTRAMUSCULAR | Status: DC | PRN
  Administered 2017-03-04: 12.5 mg via INTRAVENOUS
  Filled 2017-03-03: qty 1

## 2017-03-03 MED ORDER — ALVIMOPAN 12 MG PO CAPS
12.0000 mg | ORAL_CAPSULE | Freq: Two times a day (BID) | ORAL | Status: DC
  Administered 2017-03-04 – 2017-03-07 (×7): 12 mg via ORAL
  Filled 2017-03-03 (×7): qty 1

## 2017-03-03 MED ORDER — HYDROMORPHONE HCL 1 MG/ML IJ SOLN
0.2500 mg | INTRAMUSCULAR | Status: DC | PRN
  Administered 2017-03-03 (×4): 0.5 mg via INTRAVENOUS

## 2017-03-03 MED ORDER — DIPHENHYDRAMINE HCL 12.5 MG/5ML PO ELIX: 12.5000 mg | ORAL_SOLUTION | Freq: Four times a day (QID) | ORAL | Status: DC | PRN

## 2017-03-03 MED ORDER — OXYCODONE HCL 5 MG/5ML PO SOLN: 5.0000 mg | Freq: Once | ORAL | Status: DC | PRN

## 2017-03-03 MED ORDER — HYDROMORPHONE HCL 1 MG/ML IJ SOLN
INTRAMUSCULAR | Status: AC
  Administered 2017-03-03: 0.5 mg via INTRAVENOUS
  Filled 2017-03-03: qty 1

## 2017-03-03 MED ORDER — GABAPENTIN 300 MG PO CAPS
300.0000 mg | ORAL_CAPSULE | ORAL | Status: AC
  Administered 2017-03-03: 300 mg via ORAL
  Filled 2017-03-03: qty 1

## 2017-03-03 MED ORDER — DEXAMETHASONE SODIUM PHOSPHATE 10 MG/ML IJ SOLN
INTRAMUSCULAR | Status: AC
  Filled 2017-03-03: qty 1

## 2017-03-03 MED ORDER — PROPOFOL 10 MG/ML IV BOLUS
INTRAVENOUS | Status: DC | PRN
  Administered 2017-03-03: 200 mg via INTRAVENOUS

## 2017-03-03 MED ORDER — ONDANSETRON 4 MG PO TBDP: 4.0000 mg | ORAL_TABLET | Freq: Four times a day (QID) | ORAL | Status: DC | PRN

## 2017-03-03 MED ORDER — CELECOXIB 200 MG PO CAPS
200.0000 mg | ORAL_CAPSULE | ORAL | Status: AC
  Administered 2017-03-03: 200 mg via ORAL
  Filled 2017-03-03: qty 1

## 2017-03-03 MED ORDER — DEXAMETHASONE SODIUM PHOSPHATE 10 MG/ML IJ SOLN
INTRAMUSCULAR | Status: DC | PRN
  Administered 2017-03-03: 10 mg via INTRAVENOUS

## 2017-03-03 MED ORDER — ROCURONIUM BROMIDE 10 MG/ML (PF) SYRINGE
PREFILLED_SYRINGE | INTRAVENOUS | Status: AC
  Filled 2017-03-03: qty 5

## 2017-03-03 MED ORDER — MEPERIDINE HCL 50 MG/ML IJ SOLN: 6.2500 mg | INTRAMUSCULAR | Status: DC | PRN

## 2017-03-03 MED ORDER — LACTATED RINGERS IV SOLN
INTRAVENOUS | Status: DC | PRN
  Administered 2017-03-03: 07:00:00 via INTRAVENOUS

## 2017-03-03 MED ORDER — SUGAMMADEX SODIUM 200 MG/2ML IV SOLN
INTRAVENOUS | Status: AC
  Filled 2017-03-03: qty 2

## 2017-03-03 MED ORDER — NALOXONE HCL 0.4 MG/ML IJ SOLN: 0.4000 mg | INTRAMUSCULAR | Status: DC | PRN

## 2017-03-03 MED ORDER — PROMETHAZINE HCL 25 MG/ML IJ SOLN
6.2500 mg | INTRAMUSCULAR | Status: DC | PRN
  Administered 2017-03-03: 6.25 mg via INTRAVENOUS

## 2017-03-03 MED ORDER — HYDRALAZINE HCL 20 MG/ML IJ SOLN: 10.0000 mg | INTRAMUSCULAR | Status: DC | PRN

## 2017-03-03 MED ORDER — LIDOCAINE 2% (20 MG/ML) 5 ML SYRINGE
INTRAMUSCULAR | Status: AC
  Filled 2017-03-03: qty 5

## 2017-03-03 MED ORDER — FENTANYL CITRATE (PF) 100 MCG/2ML IJ SOLN
INTRAMUSCULAR | Status: DC | PRN
  Administered 2017-03-03 (×5): 50 ug via INTRAVENOUS

## 2017-03-03 MED ORDER — ACETAMINOPHEN 500 MG PO TABS
1000.0000 mg | ORAL_TABLET | ORAL | Status: DC
  Filled 2017-03-03: qty 2

## 2017-03-03 MED ORDER — SUGAMMADEX SODIUM 200 MG/2ML IV SOLN
INTRAVENOUS | Status: DC | PRN
  Administered 2017-03-03: 200 mg via INTRAVENOUS

## 2017-03-03 MED ORDER — OXYCODONE HCL 5 MG PO TABS: 5.0000 mg | ORAL_TABLET | Freq: Once | ORAL | Status: DC | PRN

## 2017-03-03 MED ORDER — LIDOCAINE 2% (20 MG/ML) 5 ML SYRINGE
INTRAMUSCULAR | Status: DC | PRN
  Administered 2017-03-03: 100 mg via INTRAVENOUS

## 2017-03-03 MED ORDER — HYDROMORPHONE 1 MG/ML IV SOLN
INTRAVENOUS | Status: DC
  Administered 2017-03-03: 0.9 mg via INTRAVENOUS
  Administered 2017-03-03: 3.5 mg via INTRAVENOUS
  Administered 2017-03-03: 10:00:00 via INTRAVENOUS
  Administered 2017-03-03: 0.5 mg via INTRAVENOUS
  Administered 2017-03-04: 19:00:00 via INTRAVENOUS
  Administered 2017-03-05: 0.3 mg via INTRAVENOUS
  Administered 2017-03-05: 2.7 mg via INTRAVENOUS
  Administered 2017-03-05: 0.9 mg via INTRAVENOUS
  Administered 2017-03-05: 1.2 mg via INTRAVENOUS
  Filled 2017-03-03: qty 25

## 2017-03-03 MED ORDER — ONDANSETRON HCL 4 MG/2ML IJ SOLN
INTRAMUSCULAR | Status: DC | PRN
  Administered 2017-03-03: 4 mg via INTRAVENOUS

## 2017-03-03 MED ORDER — ROCURONIUM BROMIDE 10 MG/ML (PF) SYRINGE
PREFILLED_SYRINGE | INTRAVENOUS | Status: DC | PRN
  Administered 2017-03-03: 5 mg via INTRAVENOUS
  Administered 2017-03-03: 50 mg via INTRAVENOUS

## 2017-03-03 MED ORDER — ENOXAPARIN SODIUM 40 MG/0.4ML ~~LOC~~ SOLN
40.0000 mg | SUBCUTANEOUS | Status: DC
  Filled 2017-03-03: qty 0.4

## 2017-03-03 MED ORDER — MIDAZOLAM HCL 5 MG/5ML IJ SOLN
INTRAMUSCULAR | Status: DC | PRN
  Administered 2017-03-03: 2 mg via INTRAVENOUS

## 2017-03-03 SURGICAL SUPPLY — 58 items
BLADE CLIPPER SURG (BLADE) ×3 IMPLANT
CANISTER SUCT 3000ML PPV (MISCELLANEOUS) ×3 IMPLANT
CHLORAPREP W/TINT 26ML (MISCELLANEOUS) ×3 IMPLANT
COVER MAYO STAND STRL (DRAPES) ×3 IMPLANT
COVER SURGICAL LIGHT HANDLE (MISCELLANEOUS) ×6 IMPLANT
DRAPE HALF SHEET 40X57 (DRAPES) ×3 IMPLANT
DRAPE LAPAROSCOPIC ABDOMINAL (DRAPES) ×3 IMPLANT
DRAPE UTILITY XL STRL (DRAPES) ×3 IMPLANT
DRAPE WARM FLUID 44X44 (DRAPE) ×3 IMPLANT
DRSG OPSITE POSTOP 4X10 (GAUZE/BANDAGES/DRESSINGS) IMPLANT
DRSG OPSITE POSTOP 4X6 (GAUZE/BANDAGES/DRESSINGS) ×3 IMPLANT
DRSG OPSITE POSTOP 4X8 (GAUZE/BANDAGES/DRESSINGS) ×3 IMPLANT
ELECT BLADE 6.5 EXT (BLADE) ×3 IMPLANT
ELECT CAUTERY BLADE 6.4 (BLADE) ×6 IMPLANT
ELECT REM PT RETURN 9FT ADLT (ELECTROSURGICAL) ×3
ELECTRODE REM PT RTRN 9FT ADLT (ELECTROSURGICAL) ×1 IMPLANT
GLOVE BIO SURGEON STRL SZ8 (GLOVE) ×6 IMPLANT
GLOVE BIOGEL PI IND STRL 8 (GLOVE) ×4 IMPLANT
GLOVE BIOGEL PI INDICATOR 8 (GLOVE) ×8
GLOVE ECLIPSE 8.0 STRL XLNG CF (GLOVE) ×6 IMPLANT
GLOVE SURG SIGNA 7.5 PF LTX (GLOVE) IMPLANT
GOWN STRL REUS W/ TWL LRG LVL3 (GOWN DISPOSABLE) ×2 IMPLANT
GOWN STRL REUS W/ TWL XL LVL3 (GOWN DISPOSABLE) ×4 IMPLANT
GOWN STRL REUS W/TWL 2XL LVL3 (GOWN DISPOSABLE) ×6 IMPLANT
GOWN STRL REUS W/TWL LRG LVL3 (GOWN DISPOSABLE) ×4
GOWN STRL REUS W/TWL XL LVL3 (GOWN DISPOSABLE) ×8
KIT BASIN OR (CUSTOM PROCEDURE TRAY) ×3 IMPLANT
KIT ROOM TURNOVER OR (KITS) ×3 IMPLANT
LEGGING LITHOTOMY PAIR STRL (DRAPES) IMPLANT
LIGASURE IMPACT 36 18CM CVD LR (INSTRUMENTS) ×3 IMPLANT
NS IRRIG 1000ML POUR BTL (IV SOLUTION) ×6 IMPLANT
PACK GENERAL/GYN (CUSTOM PROCEDURE TRAY) ×3 IMPLANT
PAD ARMBOARD 7.5X6 YLW CONV (MISCELLANEOUS) ×3 IMPLANT
PENCIL BUTTON HOLSTER BLD 10FT (ELECTRODE) ×3 IMPLANT
SPECIMEN JAR MEDIUM (MISCELLANEOUS) IMPLANT
SPONGE LAP 18X18 X RAY DECT (DISPOSABLE) ×3 IMPLANT
STAPLER GUN LINEAR PROX 60 (STAPLE) ×3 IMPLANT
STAPLER PROXIMATE 75MM BLUE (STAPLE) ×3 IMPLANT
STAPLER VISISTAT 35W (STAPLE) ×3 IMPLANT
SUCTION POOLE TIP (SUCTIONS) ×3 IMPLANT
SURGILUBE 2OZ TUBE FLIPTOP (MISCELLANEOUS) IMPLANT
SUT PDS AB 1 CTX 36 (SUTURE) ×6 IMPLANT
SUT PDS AB 1 TP1 96 (SUTURE) ×6 IMPLANT
SUT PROLENE 2 0 CT2 30 (SUTURE) IMPLANT
SUT PROLENE 2 0 KS (SUTURE) IMPLANT
SUT SILK 2 0 SH CR/8 (SUTURE) ×3 IMPLANT
SUT SILK 2 0 TIES 10X30 (SUTURE) ×3 IMPLANT
SUT SILK 3 0 SH CR/8 (SUTURE) ×3 IMPLANT
SUT SILK 3 0 TIES 10X30 (SUTURE) ×3 IMPLANT
SYR BULB IRRIGATION 50ML (SYRINGE) ×3 IMPLANT
TOWEL OR 17X26 10 PK STRL BLUE (TOWEL DISPOSABLE) ×6 IMPLANT
TRAY FOLEY CATH SILVER 16FR (SET/KITS/TRAYS/PACK) ×3 IMPLANT
TRAY FOLEY W/METER SILVER 16FR (SET/KITS/TRAYS/PACK) IMPLANT
TRAY PROCTOSCOPIC FIBER OPTIC (SET/KITS/TRAYS/PACK) IMPLANT
TUBE CONNECTING 12'X1/4 (SUCTIONS) ×2
TUBE CONNECTING 12X1/4 (SUCTIONS) ×4 IMPLANT
UNDERPAD 30X30 (UNDERPADS AND DIAPERS) IMPLANT
YANKAUER SUCT BULB TIP NO VENT (SUCTIONS) ×3 IMPLANT

## 2017-03-03 NOTE — Op Note (Signed)
03/03/2017  9:55 AM  PATIENT:  Jose Obrien  32 y.o. male  PRE-OPERATIVE DIAGNOSIS:  COLOSTOMY IN PLACE  POST-OPERATIVE DIAGNOSIS:  COLOSTOMY IN PLACE  PROCEDURE:  Procedure(s): COLOSTOMY TAKEDOWN  SURGEON:  Violeta GelinasBurke Annora Guderian, MD  ASSISTANTS: Estelle GrumblesSteve Gross, MD  ANESTHESIA:   general  EBL:  Total I/O In: -  Out: 170 [Urine:150; Blood:20]  BLOOD ADMINISTERED:none  DRAINS: none   SPECIMEN:  No Specimen  DISPOSITION OF SPECIMEN:  N/A  COUNTS:  YES  DICTATION: .Dragon Dictation Procedure in detail: Jose AmassCorey presents for colostomy takedown.  He was identified in the preop holding area.  He is on ERAS and Entereg protocols.  Informed consent was obtained.  He received intravenous antibiotics.  He was brought to the operating room and general endotracheal anesthesia was administered by the anesthesia staff.  Timeout procedure was performed.  I closed his ostomy with running silk suture.  His abdomen was prepped and draped in sterile fashion.  I made an incision along his previous scar and excised it completely.  The abdomen was then entered under direct vision without difficulty.  The fascia was opened to the length of the incision.  There was some limited omental adhesions superiorly which were taken down.  Next, there were a few small bowel adhesions to the colostomy and near the colostomy site which were taken down without difficulty.  The colostomy was then freed up on the inside somewhat.  Next, an elliptical incision was made around the colostomy externally.  Subcutaneous tissues were dissected down around it and it was freed up completely from the fascia and passed into the abdomen.  It was nicely viable.  Next, we mobilized the descending colon a little bit from the lateral peritoneal attachments.  Next, side to side anastomosis was made between the eighth.  The third colon proximal of this loop colostomy.  We used a GIA-75 stapler.  The mesentery was taken down the LigaSure.  The common defect  was then closed with a TA 60 stapler.  With several 3-0 silk along the staple line for good hemostasis and reinforcement.  Apical stitches of 2-0 silk x2 were placed.  There was a widely patent anastomosis and no enteric content leaked with milking of bowel contents back and forth.  The anastomosis was viable.  Next, hemostasis was insured.  We irrigated the abdomen.  We changed drapes and instruments in accordance with the colon protocol.  The ostomy site fascia was closed on the inside and on the outside with running #1 PDS.  The midline fascia was then closed with 2 lengths of running #1 looped PDS tied in the middle.  Subcutaneous tissues were irrigated and the skin of the midline and the colostomy site were closed with staples.  The ostomy site being loosely closed.  All counts were correct.  He tolerated the procedure well without apparent complication and was taken recovery in stable condition. PATIENT DISPOSITION:  PACU - hemodynamically stable.   Delay start of Pharmacological VTE agent (>24hrs) due to surgical blood loss or risk of bleeding:  no  Violeta GelinasBurke Conor Lata, MD, MPH, FACS Pager: [phone]  2/14/20199:55 AM

## 2017-03-03 NOTE — Progress Notes (Signed)
Orthopedic Tech Progress Note Patient Details:  Jose PedroCorey Obrien Mar 04, 1985 [id number]  Ortho Devices Type of Ortho Device: Abdominal binder Ortho Device/Splint Interventions: Application   Post Interventions Patient Tolerated: Well Instructions Provided: Care of device   Jose FordyceJennifer Obrien [name] 03/03/2017, 12:19 PM
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
## 2017-03-03 NOTE — Transfer of Care (Signed)
Immediate Anesthesia Transfer of Care Note  Patient: Jose Obrien  Procedure(s) Performed: COLOSTOMY TAKEDOWN (N/A Abdomen)  Patient Location: PACU  Anesthesia Type:General  Level of Consciousness: awake, alert  and oriented  Airway & Oxygen Therapy: Patient Spontanous Breathing  Post-op Assessment: Report given to RN, Post -op Vital signs reviewed and stable and Patient moving all extremities X 4  Post vital signs: Reviewed and stable  Last Vitals:  Vitals:   03/03/17 0604 03/03/17 0958  BP: (!) 141/93 (!) 169/101  Pulse: (!) 54 65  Resp: 20 19  Temp: 36.4 C 36.4 C  SpO2: 100% 98%    Last Pain:  Vitals:   03/03/17 0604  TempSrc: Oral         Complications: No apparent anesthesia complications

## 2017-03-03 NOTE — Anesthesia Postprocedure Evaluation (Signed)
Anesthesia Post Note  Patient: Jose Obrien  Procedure(s) Performed: COLOSTOMY TAKEDOWN (N/A Abdomen)     Patient location during evaluation: PACU Anesthesia Type: General Level of consciousness: awake and alert Pain management: pain level controlled Vital Signs Assessment: post-procedure vital signs reviewed and stable Respiratory status: spontaneous breathing, nonlabored ventilation and respiratory function stable Cardiovascular status: blood pressure returned to baseline and stable Postop Assessment: no apparent nausea or vomiting Anesthetic complications: no    Last Vitals:  Vitals:   03/03/17 1115 03/03/17 1130  BP: (!) 161/101 (!) 157/100  Pulse: 61 61  Resp: (!) 21 (!) 21  Temp:  36.7 C  SpO2: 97% 99%    Last Pain:  Vitals:   03/03/17 1130  TempSrc:   PainSc: Asleep                 Lowella CurbWarren Duan Adean Milosevic
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
## 2017-03-03 NOTE — Progress Notes (Signed)
No Labs per Dr. Hyacinth MeekerMiller

## 2017-03-03 NOTE — Interval H&P Note (Signed)
History and Physical Interval Note:  03/03/2017 7:58 AM  Jose Obrien  has presented today for surgery, with the diagnosis of COLOSTOMY IN PLACE  The various methods of treatment have been discussed with the patient and family. After consideration of risks, benefits and other options for treatment, the patient has consented to  Procedure(s): COLOSTOMY TAKEDOWN (N/A) as a surgical intervention .  The patient's history has been reviewed, patient examined, no change in status, stable for surgery.  I have reviewed the patient's chart and labs.  Questions were answered to the patient's satisfaction.     Liz MaladyBurke E Citlally Captain

## 2017-03-03 NOTE — Progress Notes (Signed)
Pt. States that he has had nausea and vomiting all night and he cannot swallow the tylenol ordered.

## 2017-03-03 NOTE — Anesthesia Procedure Notes (Signed)
Procedure Name: Intubation Date/Time: 03/03/2017 8:25 AM Performed by: Nils PyleBell, Flonnie Wierman T, CRNA Pre-anesthesia Checklist: Patient identified, Emergency Drugs available, Suction available and Patient being monitored Patient Re-evaluated:Patient Re-evaluated prior to induction Oxygen Delivery Method: Circle System Utilized Preoxygenation: Pre-oxygenation with 100% oxygen Induction Type: IV induction Ventilation: Mask ventilation without difficulty Laryngoscope Size: Miller and 2 Grade View: Grade I Tube type: Oral Tube size: 7.5 mm Number of attempts: 1 Airway Equipment and Method: Stylet and Oral airway Placement Confirmation: ETT inserted through vocal cords under direct vision,  positive ETCO2 and breath sounds checked- equal and bilateral Secured at: 23 cm Tube secured with: Tape Dental Injury: Teeth and Oropharynx as per pre-operative assessment

## 2017-03-04 ENCOUNTER — Encounter (HOSPITAL_COMMUNITY): Payer: Self-pay | Admitting: General Surgery

## 2017-03-04 LAB — CBC
HCT: 41.1 % (ref 39.0–52.0)
Hemoglobin: 13.3 g/dL (ref 13.0–17.0)
MCH: 29 pg (ref 26.0–34.0)
MCHC: 32.4 g/dL (ref 30.0–36.0)
MCV: 89.5 fL (ref 78.0–100.0)
PLATELETS: 239 10*3/uL (ref 150–400)
RBC: 4.59 MIL/uL (ref 4.22–5.81)
RDW: 14.1 % (ref 11.5–15.5)
WBC: 10.7 10*3/uL — AB (ref 4.0–10.5)

## 2017-03-04 LAB — BASIC METABOLIC PANEL
ANION GAP: 10 (ref 5–15)
BUN: 6 mg/dL (ref 6–20)
CO2: 27 mmol/L (ref 22–32)
CREATININE: 0.96 mg/dL (ref 0.61–1.24)
Calcium: 9.1 mg/dL (ref 8.9–10.3)
Chloride: 99 mmol/L — ABNORMAL LOW (ref 101–111)
Glucose, Bld: 107 mg/dL — ABNORMAL HIGH (ref 65–99)
Potassium: 4.3 mmol/L (ref 3.5–5.1)
SODIUM: 136 mmol/L (ref 135–145)

## 2017-03-04 MED ORDER — HYDROCORTISONE 1 % EX CREA
TOPICAL_CREAM | CUTANEOUS | Status: DC | PRN
  Administered 2017-03-04: 04:00:00 via TOPICAL
  Filled 2017-03-04: qty 28
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
## 2017-03-05 LAB — BASIC METABOLIC PANEL
ANION GAP: 10 (ref 5–15)
BUN: <5 mg/dL — ABNORMAL LOW (ref 6–20)
CALCIUM: 8.9 mg/dL (ref 8.9–10.3)
CHLORIDE: 100 mmol/L — AB (ref 101–111)
CO2: 27 mmol/L (ref 22–32)
CREATININE: 0.97 mg/dL (ref 0.61–1.24)
GFR calc non Af Amer: >60 mL/min (ref >60)
Glucose, Bld: 109 mg/dL — ABNORMAL HIGH (ref 65–99)
Potassium: 4 mmol/L (ref 3.5–5.1)
SODIUM: 137 mmol/L (ref 135–145)

## 2017-03-05 LAB — CBC
HEMATOCRIT: 37.7 % — AB (ref 39.0–52.0)
HEMOGLOBIN: 12.3 g/dL — AB (ref 13.0–17.0)
MCH: 29.1 pg (ref 26.0–34.0)
MCHC: 32.6 g/dL (ref 30.0–36.0)
MCV: 89.3 fL (ref 78.0–100.0)
Platelets: 214 10*3/uL (ref 150–400)
RBC: 4.22 MIL/uL (ref 4.22–5.81)
RDW: 13.6 % (ref 11.5–15.5)
WBC: 8.4 10*3/uL (ref 4.0–10.5)
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
## 2017-03-05 NOTE — Progress Notes (Signed)
Pt up and explained to him to be up OOB for at least total of 6 hours today, he is very motivated to move and to recover from surgery.  IS at 1500-1750, will try clear liquid diet today.  Pt steady on feet and likes ambulating in the hallway.

## 2017-03-06 MED ORDER — OXYCODONE HCL 5 MG PO TABS
5.0000 mg | ORAL_TABLET | ORAL | Status: DC | PRN
  Administered 2017-03-06 – 2017-03-08 (×9): 10 mg via ORAL
  Filled 2017-03-06 (×9): qty 2

## 2017-03-06 MED ORDER — HYDROMORPHONE HCL 1 MG/ML IJ SOLN: 0.5000 mg | INTRAMUSCULAR | Status: DC | PRN

## 2017-03-06 MED ORDER — PANTOPRAZOLE SODIUM 40 MG PO TBEC
40.0000 mg | DELAYED_RELEASE_TABLET | Freq: Every day | ORAL | Status: DC
  Administered 2017-03-06 – 2017-03-07 (×2): 40 mg via ORAL
  Filled 2017-03-06 (×2): qty 1

## 2017-03-06 MED ORDER — PNEUMOCOCCAL VAC POLYVALENT 25 MCG/0.5ML IJ INJ
0.5000 mL | INJECTION | INTRAMUSCULAR | Status: AC
  Administered 2017-03-07: 0.5 mL via INTRAMUSCULAR
  Filled 2017-03-06: qty 0.5

## 2017-03-06 MED ORDER — WHITE PETROLATUM EX OINT
TOPICAL_OINTMENT | CUTANEOUS | Status: AC
  Filled 2017-03-06: qty 28.35
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
## 2017-03-06 NOTE — Progress Notes (Signed)
Pt ambulated in the hall last night and passed gas "4 times". He has continued to pass gas during the night. No BM yet

## 2017-03-06 NOTE — Progress Notes (Signed)
Pt doing well with ambulating in the halls and with PO pain control, tolerating soft diet.
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
## 2017-03-08 MED ORDER — OXYCODONE HCL 5 MG PO TABS: 5.0000 mg | ORAL_TABLET | Freq: Four times a day (QID) | ORAL | 0 refills | Status: AC | PRN

## 2017-03-08 NOTE — Plan of Care (Signed)
  Adequate for Discharge Education: Knowledge of General Education information will improve 03/08/2017 0317 - Adequate for Discharge by Gearldine BienenstockIrish, Shanzay Hepworth B, RN 03/08/2017 [id number] - Adequate for Discharge by Gearldine BienenstockIrish, Zettie Gootee B, RN Health Behavior/Discharge Planning: Ability to manage health-related needs will improve 03/08/2017 0317 - Adequate for Discharge by Gearldine BienenstockIrish, Juniper Snyders B, RN 03/08/2017 [id number] - Adequate for Discharge by Gearldine BienenstockIrish, Nyree Yonker B, RN Clinical Measurements: Ability to maintain clinical measurements within normal limits will improve 03/08/2017 0317 - Adequate for Discharge by Gearldine BienenstockIrish, Demita Tobia B, RN 03/08/2017 [id number] - Adequate for Discharge by Gearldine BienenstockIrish, Bernestine Holsapple B, RN Will remain free from infection 03/08/2017 0317 - Adequate for Discharge by Gearldine BienenstockIrish, Ceara Wrightson B, RN 03/08/2017 [id number] - Adequate for Discharge by Gearldine BienenstockIrish, Yehonatan Grandison B, RN Diagnostic test results will improve 03/08/2017 [phone] - Adequate for Discharge by Gearldine BienenstockIrish, Rogene Meth B, RN 03/08/2017 [id number] - Adequate for Discharge by Gearldine BienenstockIrish, Lynita Groseclose B, RN Respiratory complications will improve 03/08/2017 0317 - Adequate for Discharge by Gearldine BienenstockIrish, Tanika Bracco B, RN 03/08/2017 [id number] - Adequate for Discharge by Gearldine BienenstockIrish, Ugochukwu Chichester B, RN Cardiovascular complication will be avoided 03/08/2017 0317 - Adequate for Discharge by Gearldine BienenstockIrish, Alany Borman B, RN 03/08/2017 [id number] - Adequate for Discharge by Gearldine BienenstockIrish, Amanuel Sinkfield B, RN Activity: Risk for activity intolerance will decrease 03/08/2017 0317 - Adequate for Discharge by Gearldine BienenstockIrish, Genene Kilman B, RN 03/08/2017 [id number] - Adequate for Discharge by Gearldine BienenstockIrish, Kadrian Partch B, RN Nutrition: Adequate nutrition will be maintained 03/08/2017 0317 - Adequate for Discharge by Gearldine BienenstockIrish, Koralyn Prestage B, RN 03/08/2017 [id number] - Adequate for Discharge by Gearldine BienenstockIrish, Sadaf Przybysz B, RN Coping: Level of anxiety will decrease 03/08/2017 0317 - Adequate for Discharge by Gearldine BienenstockIrish, Skyra Crichlow B, RN 03/08/2017 [id number] - Adequate for Discharge by Gearldine BienenstockIrish, Treylen Gibbs B, RN Elimination: Will not experience complications related to bowel motility 03/08/2017 0317 -  Adequate for Discharge by Gearldine BienenstockIrish, Ivone Licht B, RN 03/08/2017 [id number] - Adequate for Discharge by Gearldine BienenstockIrish, Karrington Studnicka B, RN Will not experience complications related to urinary retention 03/08/2017 0317 - Adequate for Discharge by Gearldine BienenstockIrish, Jaelah Hauth B, RN 03/08/2017 [id number] - Adequate for Discharge by Gearldine BienenstockIrish, Anajah Sterbenz B, RN Pain Managment: General experience of comfort will improve 03/08/2017 0317 - Adequate for Discharge by Gearldine BienenstockIrish, Tylynn Braniff B, RN 03/08/2017 [id number] - Adequate for Discharge by Gearldine BienenstockIrish, Fleta Borgeson B, RN Safety: Ability to remain free from injury will improve 03/08/2017 0317 - Adequate for Discharge by Gearldine BienenstockIrish, Londell Noll B, RN 03/08/2017 [id number] - Adequate for Discharge by Gearldine BienenstockIrish, Jacquelynn Friend B, RN Skin Integrity: Risk for impaired skin integrity will decrease 03/08/2017 0317 - Adequate for Discharge by Gearldine BienenstockIrish, Selam Pietsch B, RN 03/08/2017 [id number] - Adequate for Discharge by Gearldine BienenstockIrish, Eidan Muellner B, RN Spiritual Needs Ability to function at adequate level 03/08/2017 0317 - Adequate for Discharge by Gearldine BienenstockIrish, Cassey Bacigalupo B, RN 03/08/2017 [id number] - Adequate for Discharge by Gearldine BienenstockIrish, Dequincy Born B, RN Skin Integrity: Demonstration of wound healing without infection will improve 03/08/2017 0317 - Adequate for Discharge by Gearldine BienenstockIrish, Della Homan B, RN 03/08/2017 [id number] - Adequate for Discharge by Gearldine BienenstockIrish, Najib Colmenares B, RN

## 2017-03-08 NOTE — Progress Notes (Signed)
Pt discharged home in stable condition after going over discharge teaching with no concerns voiced. AVS and discharge script given before leaving unit 
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
## 2020-02-26 ENCOUNTER — Emergency Department (HOSPITAL_BASED_OUTPATIENT_CLINIC_OR_DEPARTMENT_OTHER)
Admission: EM | Admit: 2020-02-26 | Discharge: 2020-02-26 | Discharge status: Against Medical Advice | Payer: Self-pay | Attending: Emergency Medicine | Admitting: Emergency Medicine

## 2020-02-26 ENCOUNTER — Encounter (HOSPITAL_BASED_OUTPATIENT_CLINIC_OR_DEPARTMENT_OTHER): Payer: Self-pay

## 2020-02-26 ENCOUNTER — Other Ambulatory Visit: Payer: Self-pay

## 2020-02-26 ENCOUNTER — Emergency Department (HOSPITAL_BASED_OUTPATIENT_CLINIC_OR_DEPARTMENT_OTHER): Payer: Self-pay

## 2020-02-26 DIAGNOSIS — R1032 Left lower quadrant pain: Secondary | ICD-10-CM | POA: Insufficient documentation

## 2020-02-26 DIAGNOSIS — R1110 Vomiting, unspecified: Secondary | ICD-10-CM | POA: Insufficient documentation

## 2020-02-26 DIAGNOSIS — Z5329 Procedure and treatment not carried out because of patient's decision for other reasons: Secondary | ICD-10-CM | POA: Insufficient documentation

## 2020-02-26 DIAGNOSIS — M79671 Pain in right foot: Secondary | ICD-10-CM | POA: Insufficient documentation

## 2020-02-26 DIAGNOSIS — R197 Diarrhea, unspecified: Secondary | ICD-10-CM | POA: Insufficient documentation

## 2020-02-26 DIAGNOSIS — Z87891 Personal history of nicotine dependence: Secondary | ICD-10-CM | POA: Insufficient documentation

## 2020-02-26 LAB — COMPREHENSIVE METABOLIC PANEL
ALT: 28 U/L (ref 0–44)
AST: 25 U/L (ref 15–41)
Albumin: 4.4 g/dL (ref 3.5–5.0)
Alkaline Phosphatase: 73 U/L (ref 38–126)
Anion gap: 8 (ref 5–15)
BUN: 13 mg/dL (ref 6–20)
CO2: 26 mmol/L (ref 22–32)
Calcium: 9.2 mg/dL (ref 8.9–10.3)
Chloride: 104 mmol/L (ref 98–111)
Creatinine, Ser: 0.89 mg/dL (ref 0.61–1.24)
GFR, Estimated: >60 mL/min (ref >60)
Glucose, Bld: 93 mg/dL (ref 70–99)
Potassium: 4 mmol/L (ref 3.5–5.1)
Sodium: 138 mmol/L (ref 135–145)
Total Bilirubin: 0.8 mg/dL (ref 0.3–1.2)
Total Protein: 7.7 g/dL (ref 6.5–8.1)

## 2020-02-26 LAB — CBC
HCT: 44.5 % (ref 39.0–52.0)
Hemoglobin: 14.7 g/dL (ref 13.0–17.0)
MCH: 29.1 pg (ref 26.0–34.0)
MCHC: 33 g/dL (ref 30.0–36.0)
MCV: 88.1 fL (ref 80.0–100.0)
Platelets: 282 10*3/uL (ref 150–400)
RBC: 5.05 MIL/uL (ref 4.22–5.81)
RDW: 12.4 % (ref 11.5–15.5)
WBC: 4.9 10*3/uL (ref 4.0–10.5)
nRBC: 0 % (ref 0.0–0.2)

## 2020-02-26 LAB — LIPASE, BLOOD: Lipase: 102 U/L — ABNORMAL HIGH (ref 11–51)
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
## 2020-02-26 NOTE — ED Notes (Signed)
Pt states he is ready to go so he l eft after I encouraged him to stay

## 2020-02-26 NOTE — ED Notes (Signed)
Pt wanted IV out  So I took it out

## 2020-10-10 ENCOUNTER — Encounter (HOSPITAL_BASED_OUTPATIENT_CLINIC_OR_DEPARTMENT_OTHER): Payer: Self-pay

## 2020-10-10 ENCOUNTER — Other Ambulatory Visit: Payer: Self-pay

## 2020-10-10 ENCOUNTER — Emergency Department (HOSPITAL_BASED_OUTPATIENT_CLINIC_OR_DEPARTMENT_OTHER)
Admission: EM | Admit: 2020-10-10 | Discharge: 2020-10-10 | Disposition: A | Discharge status: Home / Self Care | Payer: Self-pay | Attending: Emergency Medicine | Admitting: Emergency Medicine

## 2020-10-10 DIAGNOSIS — Z87891 Personal history of nicotine dependence: Secondary | ICD-10-CM | POA: Insufficient documentation

## 2020-10-10 DIAGNOSIS — K0889 Other specified disorders of teeth and supporting structures: Secondary | ICD-10-CM

## 2020-10-10 DIAGNOSIS — K029 Dental caries, unspecified: Secondary | ICD-10-CM | POA: Insufficient documentation

## 2020-10-10 MED ORDER — HYDROCODONE-ACETAMINOPHEN 5-325 MG PO TABS: 1.0000 | ORAL_TABLET | Freq: Four times a day (QID) | ORAL | 0 refills | Status: AC | PRN

## 2020-10-10 NOTE — ED Triage Notes (Signed)
Tooth pain upper and lower right teeth. Took Ibuprofen for it w/o relief, has not been to dentist for it. Has been going on for a while but got worse last night.
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
## 2020-10-10 NOTE — Discharge Instructions (Addendum)
Your evaluated emergency department today for dental pain.  I do not see any evidence of abscess that would need to be drained in your mouth.  Plan to prescribe pain medication, to carry you over to dentist appointment on Monday.  It is incredibly important that you make this appointment as these of the people who can decide whether or not to remove your tooth.  You can continue ibuprofen or aleve to help with inflammation.  Continue to monitor your symptoms and return to the Emergency Department for new or worsening pain, difficulty swallowing, sore throat or fevers.

## 2020-10-12 ENCOUNTER — Encounter (HOSPITAL_BASED_OUTPATIENT_CLINIC_OR_DEPARTMENT_OTHER): Payer: Self-pay

## 2020-10-12 ENCOUNTER — Other Ambulatory Visit: Payer: Self-pay

## 2020-10-12 ENCOUNTER — Emergency Department (HOSPITAL_BASED_OUTPATIENT_CLINIC_OR_DEPARTMENT_OTHER)
Admission: EM | Admit: 2020-10-12 | Discharge: 2020-10-12 | Disposition: A | Discharge status: Home / Self Care | Payer: Self-pay | Attending: Emergency Medicine | Admitting: Emergency Medicine

## 2020-10-12 DIAGNOSIS — K029 Dental caries, unspecified: Secondary | ICD-10-CM | POA: Insufficient documentation

## 2020-10-12 DIAGNOSIS — Z87891 Personal history of nicotine dependence: Secondary | ICD-10-CM | POA: Insufficient documentation

## 2020-10-12 DIAGNOSIS — K0889 Other specified disorders of teeth and supporting structures: Secondary | ICD-10-CM

## 2020-10-12 MED ORDER — PENICILLIN V POTASSIUM 500 MG PO TABS: 500.0000 mg | ORAL_TABLET | Freq: Four times a day (QID) | ORAL | 0 refills | Status: AC

## 2020-10-12 NOTE — Discharge Instructions (Signed)
I prescribed you an antibiotic called penicillin VK.  Please take this 4 times a day for the next 10 days.  Do not stop taking this medication early even if you feel that your symptoms have improved.   I recommend a combination of tylenol and ibuprofen for management of your pain. You can take a low dose of both at the same time. I recommend 500 mg of Tylenol combined with 600 mg of ibuprofen. This is one maximum strength Tylenol and three regular ibuprofen. You can take these 2-3 times for day for your pain. Please try to take these medications with a small amount of food as well to prevent upsetting your stomach.  If you develop any new or worsening symptoms please come back to the emergency department immediately.  Otherwise, please follow-up with your dentist tomorrow.  It was a pleasure to meet you.

## 2020-10-12 NOTE — ED Notes (Signed)
ED Provider at bedside. [place], [place]

## 2020-10-12 NOTE — ED Triage Notes (Signed)
Pt arrives with c/o continued dental pain, was seen here on the 23rd for same and given pain medication. Pt reports continued pain and would like something for "infection"
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
## 2020-12-24 ENCOUNTER — Other Ambulatory Visit: Payer: Self-pay

## 2020-12-24 ENCOUNTER — Encounter (HOSPITAL_BASED_OUTPATIENT_CLINIC_OR_DEPARTMENT_OTHER): Payer: Self-pay

## 2020-12-24 ENCOUNTER — Emergency Department (HOSPITAL_BASED_OUTPATIENT_CLINIC_OR_DEPARTMENT_OTHER)
Admission: EM | Admit: 2020-12-24 | Discharge: 2020-12-24 | Discharge status: Against Medical Advice | Payer: Self-pay | Attending: Emergency Medicine | Admitting: Emergency Medicine

## 2020-12-24 DIAGNOSIS — K0889 Other specified disorders of teeth and supporting structures: Secondary | ICD-10-CM | POA: Insufficient documentation

## 2020-12-24 DIAGNOSIS — Z87891 Personal history of nicotine dependence: Secondary | ICD-10-CM | POA: Insufficient documentation

## 2020-12-24 MED ORDER — AMOXICILLIN-POT CLAVULANATE 875-125 MG PO TABS: 1.0000 | ORAL_TABLET | Freq: Two times a day (BID) | ORAL | 0 refills | Status: AC

## 2020-12-24 NOTE — ED Notes (Signed)
Pt left stated that he could not wait any longer.  Had to pick up his kids.  Pt instructed to return for any new or worsening symptoms.
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
## 2020-12-24 NOTE — ED Triage Notes (Signed)
Pt arrives with c/o pain to teeth. States he has a bump near his back teeth that has been draining 'white stuff' also reports tooth recently broke off.

## 2022-02-09 ENCOUNTER — Emergency Department (HOSPITAL_BASED_OUTPATIENT_CLINIC_OR_DEPARTMENT_OTHER)
Admission: EM | Admit: 2022-02-09 | Discharge: 2022-02-09 | Disposition: A | Discharge status: Home / Self Care | Payer: Self-pay | Attending: Emergency Medicine | Admitting: Emergency Medicine

## 2022-02-09 ENCOUNTER — Other Ambulatory Visit: Payer: Self-pay

## 2022-02-09 ENCOUNTER — Encounter (HOSPITAL_BASED_OUTPATIENT_CLINIC_OR_DEPARTMENT_OTHER): Payer: Self-pay

## 2022-02-09 DIAGNOSIS — K644 Residual hemorrhoidal skin tags: Secondary | ICD-10-CM | POA: Insufficient documentation

## 2022-02-09 LAB — OCCULT BLOOD X 1 CARD TO LAB, STOOL: Fecal Occult Bld: POSITIVE — AB

## 2022-02-09 MED ORDER — PSYLLIUM 58.6 % PO PACK: 1.0000 | PACK | Freq: Every day | ORAL | 0 refills | Status: AC

## 2022-02-09 MED ORDER — HYDROCORTISONE 0.5 % EX OINT: 1.0000 | TOPICAL_OINTMENT | Freq: Two times a day (BID) | CUTANEOUS | 0 refills | Status: AC

## 2022-02-09 NOTE — Discharge Instructions (Addendum)
Evaluation of your rectal pain revealed that you do have an external hemorrhoid.  This is likely causing your pain with defecation and wiping.  Recommend they continue conservative treatment at home.  I have sent topical hydrocortisone which is a steroid that you can apply to the hemorrhoid itself to relieve pain and inflammation.  I also sent Metamucil to your pharmacy as a fiber supplement and stool softener this will help with the consistency of your stool so as to reduce pain with defecation.  Advised to follow-up with PCP if your symptoms persist.
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
## 2022-06-01 ENCOUNTER — Emergency Department (HOSPITAL_BASED_OUTPATIENT_CLINIC_OR_DEPARTMENT_OTHER)
Admission: EM | Admit: 2022-06-01 | Discharge: 2022-06-01 | Disposition: A | Discharge status: Home / Self Care | Payer: Medicaid Other | Attending: Emergency Medicine | Admitting: Emergency Medicine

## 2022-06-01 ENCOUNTER — Other Ambulatory Visit: Payer: Self-pay

## 2022-06-01 DIAGNOSIS — S80812A Abrasion, left lower leg, initial encounter: Secondary | ICD-10-CM | POA: Diagnosis not present

## 2022-06-01 DIAGNOSIS — Z5321 Procedure and treatment not carried out due to patient leaving prior to being seen by health care provider: Secondary | ICD-10-CM | POA: Diagnosis not present

## 2022-06-01 DIAGNOSIS — S8992XA Unspecified injury of left lower leg, initial encounter: Secondary | ICD-10-CM | POA: Diagnosis present

## 2022-06-01 DIAGNOSIS — Y9301 Activity, walking, marching and hiking: Secondary | ICD-10-CM | POA: Insufficient documentation

## 2022-06-01 DIAGNOSIS — W108XXA Fall (on) (from) other stairs and steps, initial encounter: Secondary | ICD-10-CM | POA: Diagnosis not present

## 2022-06-01 NOTE — ED Triage Notes (Signed)
Patient presents to ED via POV from home. Here post fall. Patient was walking down 7 steps, reports slipping and falling in the middle and falling to the bottom. Denies LOC, hitting head or being on blood thinners. Small abrasion noted to left shin. No active bleeding.

## 2022-06-01 NOTE — ED Notes (Signed)
Pt called for room assignment w/o answer.  

## 2022-06-01 NOTE — ED Notes (Signed)
Attempted to call Pt for room assignment w/o answer x 1.
